# Patient Record
Sex: Female | Born: 1960
Health system: Southern US, Community
[De-identification: ages and names within clinical notes are randomized; demographics above are authoritative.]

## PROBLEM LIST (undated history)

## (undated) DIAGNOSIS — F32A Depression, unspecified: Secondary | ICD-10-CM

## (undated) DIAGNOSIS — F329 Major depressive disorder, single episode, unspecified: Secondary | ICD-10-CM

## (undated) DIAGNOSIS — I10 Essential (primary) hypertension: Secondary | ICD-10-CM

## (undated) HISTORY — DX: Major depressive disorder, single episode, unspecified: F32.9

## (undated) HISTORY — DX: Depression, unspecified: F32.A

## (undated) HISTORY — DX: Essential (primary) hypertension: I10

---

## 2017-03-15 ENCOUNTER — Encounter: Payer: Self-pay | Admitting: Family

## 2017-03-15 ENCOUNTER — Encounter (INDEPENDENT_AMBULATORY_CARE_PROVIDER_SITE_OTHER): Payer: Self-pay

## 2017-03-15 ENCOUNTER — Ambulatory Visit (INDEPENDENT_AMBULATORY_CARE_PROVIDER_SITE_OTHER): Payer: Managed Care, Other (non HMO) | Admitting: Family

## 2017-03-15 VITALS — BP 136/88 | HR 76 | Temp 98.2°F | Ht 64.0 in | Wt 188.0 lb

## 2017-03-15 DIAGNOSIS — Z0000 Encounter for general adult medical examination without abnormal findings: Secondary | ICD-10-CM | POA: Insufficient documentation

## 2017-03-15 DIAGNOSIS — Z7689 Persons encountering health services in other specified circumstances: Secondary | ICD-10-CM | POA: Diagnosis not present

## 2017-03-15 DIAGNOSIS — I10 Essential (primary) hypertension: Secondary | ICD-10-CM | POA: Insufficient documentation

## 2017-03-15 DIAGNOSIS — F339 Major depressive disorder, recurrent, unspecified: Secondary | ICD-10-CM

## 2017-03-15 DIAGNOSIS — R21 Rash and other nonspecific skin eruption: Secondary | ICD-10-CM | POA: Diagnosis not present

## 2017-03-15 MED ORDER — CITALOPRAM HYDROBROMIDE 20 MG PO TABS: ORAL_TABLET | ORAL | 1 refills | Status: DC

## 2017-03-15 MED ORDER — LISINOPRIL-HYDROCHLOROTHIAZIDE 20-12.5 MG PO TABS: 1.0000 | ORAL_TABLET | Freq: Every day | ORAL | 1 refills | Status: DC

## 2017-03-15 MED ORDER — FLUCONAZOLE 150 MG PO TABS: 150.0000 mg | ORAL_TABLET | Freq: Once | ORAL | 1 refills | Status: AC

## 2017-03-15 MED ORDER — CLOTRIMAZOLE 1 % EX CREA: 1.0000 "application " | TOPICAL_CREAM | Freq: Two times a day (BID) | CUTANEOUS | 1 refills | Status: DC

## 2017-03-15 NOTE — Patient Instructions (Addendum)
Labs when fasting  Let me know about hiv screen  Please return for annual physical in new year.   Yeast infection in groin:   HOLD celexa for one day when take diflucan due to interaction; may use cream now or in future.   Daily cleansing of intertriginous skin with a mild cleanser followed by drying of affected area with a hair dryer on a cool setting ?Aeration of affected area when feasible ?Daily application of drying powders ?Use of absorbent material or clothing, such as cotton or merino wool, to separate skin in folds   We placed a referral for mammogram this year. I asked that you call one the below locations and schedule this when it is convenient for you.   As discussed, I would like you to ask for 3D mammogram over the traditional 2D mammogram as new evidence suggest 3D is superior.   Please note that NOT all insurance companies cover 3D and you may have to pay a higher copay. You may call your insurance company to further clarify your benefits.   Options for Mammogram.    [facility]  [address]  [place], [place]  [phone]  * Offers 3D mammogram if you ask*

## 2017-03-15 NOTE — Progress Notes (Signed)
Subjective:    Patient ID: Kimberly Mclaughlin, female    DOB: 03-28-1961, 56 y.o.   MRN: [id number]  CC: Kimberly Mclaughlin is a 56 y.o. female who presents today to establish care.    HPI: No recent PCP  Rash bilateral groins x 6 months ago, worsening. Itchy. Didn't tried otc cream. No vaginal discharge, fever, abdominal pain.   Concern for moles on chest. Wanders if normal. No changed or bleeding.   Depression - doing well on celexa. Had stopped the medication suddenly recently and could tell more depressed. Started back on and thinks  is right dose. No thoughts of hurting herself or anyone else.   HTN- compliant with medication. Denies exertional chest pain or pressure, numbness or tingling radiating to left arm or jaw, palpitations, dizziness, frequent headaches, changes in vision, or shortness of breath.        HISTORY:  Past Medical History:  Diagnosis Date  . Depression   . Hypertension    No past surgical history on file. Family History  Problem Relation Age of Onset  . Cancer Mother        breast  . Heart disease Maternal Grandmother   . Hypertension Maternal Grandmother   . Diabetes Maternal Grandmother   . Heart disease Maternal Grandfather   . Hypertension Maternal Grandfather   . Diabetes Maternal Grandfather     Allergies: Patient has no allergy information on record. No current outpatient prescriptions on file prior to visit.   No current facility-administered medications on file prior to visit.     Social History  Substance Use Topics  . Smoking status: Former Games developer  . Smokeless tobacco: Never Used  . Alcohol use No    Review of Systems  Constitutional: Negative for chills and fever.  Respiratory: Negative for cough.   Cardiovascular: Negative for chest pain and palpitations.  Gastrointestinal: Negative for nausea and vomiting.  Skin: Positive for rash.  Psychiatric/Behavioral: Negative for suicidal ideas.      Objective:    BP 136/88    Pulse 76   Temp 98.2 F (36.8 C) (Oral)   Ht  (1.626 m)   Wt 188 lb (85.3 kg)   SpO2 98%   BMI 32.27 kg/m  BP Readings from Last 3 Encounters:  03/15/17 136/88   Wt Readings from Last 3 Encounters:  03/15/17 188 lb (85.3 kg)    Physical Exam  Constitutional: She appears well-developed and well-nourished.  Eyes: Conjunctivae are normal.  Cardiovascular: Normal rate, regular rhythm, normal heart sounds and normal pulses.   Pulmonary/Chest: Effort normal and breath sounds normal. She has no wheezes. She has no rhonchi. She has no rales.  Neurological: She is alert.  Skin: Skin is warm and dry. No erythema.      darkened areas of skin noted bilateral groin. No satellite lesions, discharge, or increased warmth.   Multiple hyperpigmented acrochordons noted on chest wall. Non bleeding.   Psychiatric: She has a normal mood and affect. Her speech is normal and behavior is normal. Thought content normal.  Vitals reviewed.      Assessment & Plan:   Problem List Items Addressed This Visit      Cardiovascular and Mediastinum   Essential hypertension    At goal. Will continue regimen.       Relevant Medications   lisinopril-hydrochlorothiazide (PRINZIDE,ZESTORETIC) 20-12.5 MG tablet     Musculoskeletal and Integument   Rash - Primary    Working diagnosis intertrigo candidiasis based on location and  puritus. Will treat with diflucan. Advised topical antifungal however patient is on  celexa and she preferred to hold celexa for one day which she has in the past with adverse symptoms. She will use the topical antifungal for future rash earlier on.  For suspected skin tags, advised dermatology consult to ensure no biopsy required.       Relevant Medications   clotrimazole (LOTRIMIN) 1 % cream   fluconazole (DIFLUCAN) 150 MG tablet   Other Relevant Orders   Ambulatory referral to Dermatology     Other   Depression, recurrent (HCC)    stable. Continue current  regimen      Relevant Medications   citalopram (CELEXA) 20 MG tablet   Encounter to establish care   Relevant Orders   MM SCREENING BREAST TOMO BILATERAL   Comprehensive metabolic panel   CBC with Differential/Platelet   Hemoglobin A1c   Hepatitis C antibody   Lipid panel   TSH   VITAMIN D 25 Hydroxy (Vit-D Deficiency, Fractures)       I have changed Kimberly Mclaughlin's lisinopril-hydrochlorothiazide. I am also having her start on clotrimazole and fluconazole. Additionally, I am having her maintain her citalopram.   Meds ordered this encounter  Medications  . DISCONTD: citalopram (CELEXA) 20 MG tablet    Sig: TK 1 T PO QD    Refill:  0  . DISCONTD: lisinopril-hydrochlorothiazide (PRINZIDE,ZESTORETIC) 20-12.5 MG tablet    Sig: Take by mouth.  . citalopram (CELEXA) 20 MG tablet    Sig: TK 1 T PO QD    Dispense:  90 tablet    Refill:  1  . lisinopril-hydrochlorothiazide (PRINZIDE,ZESTORETIC) 20-12.5 MG tablet    Sig: Take 1 tablet by mouth daily.    Dispense:  90 tablet    Refill:  1  . clotrimazole (LOTRIMIN) 1 % cream    Sig: Apply 1 application topically 2 (two) times daily.    Dispense:  30 g    Refill:  1    Order Specific Question:   Supervising Provider    Answer:   Darrick Huntsman, TERESA L [[id number]]  . fluconazole (DIFLUCAN) 150 MG tablet    Sig: Take 1 tablet (150 mg total) by mouth once. Take one tablet PO once. If continue to have symptoms, may take one tablet PO 3 days later.    Dispense:  2 tablet    Refill:  1    Order Specific Question:   Supervising Provider    Answer:   Sherlene Shams [[id number]]    Return precautions given.   Risks, benefits, and alternatives of the medications and treatment plan prescribed today were discussed, and patient expressed understanding.   Education regarding symptom management and diagnosis given to patient on AVS.  Continue to follow with Kimberly Grana, FNP for routine health maintenance.   Renne Crigler and I agreed with  plan.   [name], FNP

## 2017-03-15 NOTE — Assessment & Plan Note (Signed)
At goal. Will continue regimen. 

## 2017-03-15 NOTE — Assessment & Plan Note (Addendum)
Working diagnosis intertrigo candidiasis based on location and puritus. Will treat with diflucan. Advised topical antifungal however patient is on  celexa and she preferred to hold celexa for one day which she has in the past with adverse symptoms. She will use the topical antifungal for future rash earlier on.  For suspected skin tags, advised dermatology consult to ensure no biopsy required.

## 2017-03-15 NOTE — Assessment & Plan Note (Signed)
Mammogram and cpe labs ordered; patient will return for cpe

## 2017-03-15 NOTE — Assessment & Plan Note (Signed)
stable. Continue current regimen

## 2017-03-15 NOTE — Progress Notes (Signed)
Pre visit review using our clinic review tool, if applicable. No additional management support is needed unless otherwise documented below in the visit note. 

## 2017-03-18 ENCOUNTER — Other Ambulatory Visit: Payer: Self-pay

## 2017-03-18 DIAGNOSIS — F339 Major depressive disorder, recurrent, unspecified: Secondary | ICD-10-CM

## 2017-03-18 DIAGNOSIS — I10 Essential (primary) hypertension: Secondary | ICD-10-CM

## 2017-03-18 MED ORDER — CITALOPRAM HYDROBROMIDE 20 MG PO TABS: ORAL_TABLET | ORAL | 1 refills | Status: DC

## 2017-03-18 MED ORDER — LISINOPRIL-HYDROCHLOROTHIAZIDE 20-12.5 MG PO TABS: 1.0000 | ORAL_TABLET | Freq: Every day | ORAL | 1 refills | Status: DC

## 2017-03-21 ENCOUNTER — Other Ambulatory Visit: Payer: Managed Care, Other (non HMO)

## 2017-03-22 ENCOUNTER — Telehealth: Payer: Self-pay | Admitting: Family

## 2017-03-22 NOTE — Telephone Encounter (Signed)
LVM FOR PT TO RTC. NEED TO KNOW WHERE HER LAST MAMMOGRAM WAS PERFORMED, WHEN, AND IF IT WAS NORMAL.

## 2017-06-30 DIAGNOSIS — Z1231 Encounter for screening mammogram for malignant neoplasm of breast: Secondary | ICD-10-CM | POA: Diagnosis not present

## 2017-06-30 LAB — HM MAMMOGRAPHY

## 2017-08-15 ENCOUNTER — Other Ambulatory Visit: Payer: Self-pay | Admitting: Family

## 2017-08-15 DIAGNOSIS — F339 Major depressive disorder, recurrent, unspecified: Secondary | ICD-10-CM

## 2017-09-07 ENCOUNTER — Other Ambulatory Visit: Payer: Self-pay | Admitting: Family

## 2017-09-07 DIAGNOSIS — I10 Essential (primary) hypertension: Secondary | ICD-10-CM

## 2018-01-27 ENCOUNTER — Other Ambulatory Visit: Payer: Self-pay | Admitting: Family

## 2018-01-27 DIAGNOSIS — I10 Essential (primary) hypertension: Secondary | ICD-10-CM

## 2018-01-31 ENCOUNTER — Other Ambulatory Visit: Payer: Self-pay

## 2018-02-23 ENCOUNTER — Other Ambulatory Visit: Payer: Self-pay | Admitting: Family

## 2018-02-23 DIAGNOSIS — F339 Major depressive disorder, recurrent, unspecified: Secondary | ICD-10-CM

## 2018-03-01 ENCOUNTER — Ambulatory Visit (INDEPENDENT_AMBULATORY_CARE_PROVIDER_SITE_OTHER): Payer: 59 | Admitting: Family

## 2018-03-01 ENCOUNTER — Encounter: Payer: Self-pay | Admitting: Family

## 2018-03-01 VITALS — BP 130/100 | HR 81 | Temp 98.7°F | Resp 16 | Ht 64.0 in | Wt 192.2 lb

## 2018-03-01 DIAGNOSIS — I10 Essential (primary) hypertension: Secondary | ICD-10-CM

## 2018-03-01 DIAGNOSIS — Z7689 Persons encountering health services in other specified circumstances: Secondary | ICD-10-CM | POA: Diagnosis not present

## 2018-03-01 DIAGNOSIS — F339 Major depressive disorder, recurrent, unspecified: Secondary | ICD-10-CM | POA: Diagnosis not present

## 2018-03-01 DIAGNOSIS — Z0000 Encounter for general adult medical examination without abnormal findings: Secondary | ICD-10-CM

## 2018-03-01 MED ORDER — CITALOPRAM HYDROBROMIDE 40 MG PO TABS: 40.0000 mg | ORAL_TABLET | Freq: Every day | ORAL | 3 refills | Status: DC

## 2018-03-01 NOTE — Assessment & Plan Note (Addendum)
Elevated today. Asymptomatic. ADvised her to purchase BP cuff which she verbalized understanding. Will keep log at home since had been controlled in the past. She will call with readings so we can adjust  Medications as appropriate.  Note: no baseline EKG in chart. CMA will call patient so she can return for this.

## 2018-03-01 NOTE — Assessment & Plan Note (Signed)
CBE performed. Declines pelvic exam in the absence of complaints and she would like to establish with GYN. Referral placed. Mammogram utd. Orderd colonoscopy, dermatology, and genetics consult.

## 2018-03-01 NOTE — Assessment & Plan Note (Signed)
Doing well on current therapy. Will add on counseling, referral placed.

## 2018-03-01 NOTE — Patient Instructions (Addendum)
Fasting labs tomorrow  Bring office paperwork for Tdap vaccine  Today we discussed referrals, orders. Dermatology, colonoscopy, oncology for genetics   I have placed these orders in the system for you.  Please be sure to give [place] a call if you have not heard from our office regarding this. We should hear from [place] within ONE week with information regarding your appointment. If not, please let me know immediately.   Monitor blood pressure,  Goal is less than 130/80; if persistently higher, please make sooner follow up appointment so we can recheck you blood pressure and manage medications  Health Maintenance, Female Adopting a healthy lifestyle and getting preventive care can go a long way to promote health and wellness. Talk with your health care provider about what schedule of regular examinations is right for you. This is a good chance for you to check in with your provider about disease prevention and staying healthy. In between checkups, there are plenty of things you can do on your own. Experts have done a lot of research about which lifestyle changes and preventive measures are most likely to keep you healthy. Ask your health care provider for more information. Weight and diet Eat a healthy diet  Be sure to include plenty of vegetables, fruits, low-fat dairy products, and lean protein.  Do not eat a lot of foods high in solid fats, added sugars, or salt.  Get regular exercise. This is one of the most important things you can do for your health. ? Most adults should exercise for at least 150 minutes each week. The exercise should increase your heart rate and make you sweat (moderate-intensity exercise). ? Most adults should also do strengthening exercises at least twice a week. This is in addition to the moderate-intensity exercise.  Maintain a healthy weight  Body mass index (BMI) is a measurement that can be used to identify possible weight problems. It estimates body fat based on height  and weight. Your health care provider can help determine your BMI and help you achieve or maintain a healthy weight.  For females 2 years of age and older: ? A BMI below 18.5 is considered underweight. ? A BMI of 18.5 to 24.9 is normal. ? A BMI of 25 to 29.9 is considered overweight. ? A BMI of 30 and above is considered obese.  Watch levels of cholesterol and blood lipids  You should start having your blood tested for lipids and cholesterol at 56 years of age, then have this test every 5 years.  You may need to have your cholesterol levels checked more often if: ? Your lipid or cholesterol levels are high. ? You are older than 56 years of age. ? You are at high risk for heart disease.  Cancer screening Lung Cancer  Lung cancer screening is recommended for adults 44-21 years old who are at high risk for lung cancer because of a history of smoking.  A yearly low-dose CT scan of the lungs is recommended for people who: ? Currently smoke. ? Have quit within the past 15 years. ? Have at least a 30-pack-year history of smoking. A pack year is smoking an average of one pack of cigarettes a day for 1 year.  Yearly screening should continue until it has been 15 years since you quit.  Yearly screening should stop if you develop a health problem that would prevent you from having lung cancer treatment.  Breast Cancer  Practice breast self-awareness. This means understanding how your breasts normally  appear and feel.  It also means doing regular breast self-exams. Let your health care provider know about any changes, no matter how small.  If you are in your 20s or 30s, you should have a clinical breast exam (CBE) by a health care provider every 1-3 years as part of a regular health exam.  If you are 24 or older, have a CBE every year. Also consider having a breast X-ray (mammogram) every year.  If you have a family history of breast cancer, talk to your health care provider about  genetic screening.  If you are at high risk for breast cancer, talk to your health care provider about having an MRI and a mammogram every year.  Breast cancer gene (BRCA) assessment is recommended for women who have family members with BRCA-related cancers. BRCA-related cancers include: ? Breast. ? Ovarian. ? Tubal. ? Peritoneal cancers.  Results of the assessment will determine the need for genetic counseling and BRCA1 and BRCA2 testing.  Cervical Cancer Your health care provider may recommend that you be screened regularly for cancer of the pelvic organs (ovaries, uterus, and vagina). This screening involves a pelvic examination, including checking for microscopic changes to the surface of your cervix (Pap test). You may be encouraged to have this screening done every 3 years, beginning at age 46.  For women ages 30-65, health care providers may recommend pelvic exams and Pap testing every 3 years, or they may recommend the Pap and pelvic exam, combined with testing for human papilloma virus (HPV), every 5 years. Some types of HPV increase your risk of cervical cancer. Testing for HPV may also be done on women of any age with unclear Pap test results.  Other health care providers may not recommend any screening for nonpregnant women who are considered low risk for pelvic cancer and who do not have symptoms. Ask your health care provider if a screening pelvic exam is right for you.  If you have had past treatment for cervical cancer or a condition that could lead to cancer, you need Pap tests and screening for cancer for at least 20 years after your treatment. If Pap tests have been discontinued, your risk factors (such as having a new sexual partner) need to be reassessed to determine if screening should resume. Some women have medical problems that increase the chance of getting cervical cancer. In these cases, your health care provider may recommend more frequent screening and Pap  tests.  Colorectal Cancer  This type of cancer can be detected and often prevented.  Routine colorectal cancer screening usually begins at 56 years of age and continues through 56 years of age.  Your health care provider may recommend screening at an earlier age if you have risk factors for colon cancer.  Your health care provider may also recommend using home test kits to check for hidden blood in the stool.  A small camera at the end of a tube can be used to examine your colon directly (sigmoidoscopy or colonoscopy). This is done to check for the earliest forms of colorectal cancer.  Routine screening usually begins at age 56.  Direct examination of the colon should be repeated every 5-10 years through 56 years of age. However, you may need to be screened more often if early forms of precancerous polyps or small growths are found.  Skin Cancer  Check your skin from head to toe regularly.  Tell your health care provider about any new moles or changes in moles, especially  if there is a change in a mole's shape or color.  Also tell your health care provider if you have a mole that is larger than the size of a pencil eraser.  Always use sunscreen. Apply sunscreen liberally and repeatedly throughout the day.  Protect yourself by wearing long sleeves, pants, a wide-brimmed hat, and sunglasses whenever you are outside.  Heart disease, diabetes, and high blood pressure  High blood pressure causes heart disease and increases the risk of stroke. High blood pressure is more likely to develop in: ? People who have blood pressure in the high end of the normal range (130-139/85-89 mm Hg). ? People who are overweight or obese. ? People who are African American.  If you are 75-74 years of age, have your blood pressure checked every 3-5 years. If you are 65 years of age or older, have your blood pressure checked every year. You should have your blood pressure measured twice-once when you are at  a hospital or clinic, and once when you are not at a hospital or clinic. Record the average of the two measurements. To check your blood pressure when you are not at a hospital or clinic, you can use: ? An automated blood pressure machine at a pharmacy. ? A home blood pressure monitor.  If you are between 55 years and 71 years old, ask your health care provider if you should take aspirin to prevent strokes.  Have regular diabetes screenings. This involves taking a blood sample to check your fasting blood sugar level. ? If you are at a normal weight and have a low risk for diabetes, have this test once every three years after 56 years of age. ? If you are overweight and have a high risk for diabetes, consider being tested at a younger age or more often. Preventing infection Hepatitis B  If you have a higher risk for hepatitis B, you should be screened for this virus. You are considered at high risk for hepatitis B if: ? You were born in a country where hepatitis B is common. Ask your health care provider which countries are considered high risk. ? Your parents were born in a high-risk country, and you have not been immunized against hepatitis B (hepatitis B vaccine). ? You have HIV or AIDS. ? You use needles to inject street drugs. ? You live with someone who has hepatitis B. ? You have had sex with someone who has hepatitis B. ? You get hemodialysis treatment. ? You take certain medicines for conditions, including cancer, organ transplantation, and autoimmune conditions.  Hepatitis C  Blood testing is recommended for: ? Everyone born from [birth year] through 1964. ? Anyone with known risk factors for hepatitis C.  Sexually transmitted infections (STIs)  You should be screened for sexually transmitted infections (STIs) including gonorrhea and chlamydia if: ? You are sexually active and are younger than 56 years of age. ? You are older than 56 years of age and your health care provider tells  you that you are at risk for this type of infection. ? Your sexual activity has changed since you were last screened and you are at an increased risk for chlamydia or gonorrhea. Ask your health care provider if you are at risk.  If you do not have HIV, but are at risk, it may be recommended that you take a prescription medicine daily to prevent HIV infection. This is called pre-exposure prophylaxis (PrEP). You are considered at risk if: ? You are sexually active  and do not regularly use condoms or know the HIV status of your partner(s). ? You take drugs by injection. ? You are sexually active with a partner who has HIV.  Talk with your health care provider about whether you are at high risk of being infected with HIV. If you choose to begin PrEP, you should first be tested for HIV. You should then be tested every 3 months for as long as you are taking PrEP. Pregnancy  If you are premenopausal and you may become pregnant, ask your health care provider about preconception counseling.  If you may become pregnant, take 400 to 800 micrograms (mcg) of folic acid every day.  If you want to prevent pregnancy, talk to your health care provider about birth control (contraception). Osteoporosis and menopause  Osteoporosis is a disease in which the bones lose minerals and strength with aging. This can result in serious bone fractures. Your risk for osteoporosis can be identified using a bone density scan.  If you are 51 years of age or older, or if you are at risk for osteoporosis and fractures, ask your health care provider if you should be screened.  Ask your health care provider whether you should take a calcium or vitamin D supplement to lower your risk for osteoporosis.  Menopause may have certain physical symptoms and risks.  Hormone replacement therapy may reduce some of these symptoms and risks. Talk to your health care provider about whether hormone replacement therapy is right for  you. Follow these instructions at home:  Schedule regular health, dental, and eye exams.  Stay current with your immunizations.  Do not use any tobacco products including cigarettes, chewing tobacco, or electronic cigarettes.  If you are pregnant, do not drink alcohol.  If you are breastfeeding, limit how much and how often you drink alcohol.  Limit alcohol intake to no more than 1 drink per day for nonpregnant women. One drink equals 12 ounces of beer, 5 ounces of wine, or 1 ounces of hard liquor.  Do not use street drugs.  Do not share needles.  Ask your health care provider for help if you need support or information about quitting drugs.  Tell your health care provider if you often feel depressed.  Tell your health care provider if you have ever been abused or do not feel safe at home. This information is not intended to replace advice given to you by your health care provider. Make sure you discuss any questions you have with your health care provider. Document Released: 11/30/2010 Document Revised: 10/23/2015 Document Reviewed: 02/18/2015 Elsevier Interactive Patient Education  [employer].

## 2018-03-01 NOTE — Progress Notes (Signed)
Subjective:    Patient ID: Kimberly Mclaughlin, female    DOB: 1960/08/06, 57 y.o.   MRN: [id number]  CC: Kimberly Mclaughlin is a 57 y.o. female who presents today for physical exam.    HPI: HTN- compliant with medication. Doesn't check at home.  Denies exertional chest pain or pressure, numbness or tingling radiating to left arm or jaw, palpitations, dizziness, frequent headaches, changes in vision, or shortness of breath.    Depression- doing well on medication, 40mg  celexa. 'more jovial on medication at work.' supposed to be on celexa 40mg . No si/hi. Has done counseling in the past and would do again.    Colorectal Cancer Screening: due Breast Cancer Screening: Mammogram UTD. Mother had breast cancer. Interested in genetic consult Cervical Cancer Screening: due. Would like to see GYN.  Bone Health screening/DEXA for 65+: No increased fracture risk. Defer screening at this time. Lung Cancer Screening: Doesn't have 30 year pack year history and age > 55 years.  Immunizations       Tetanus - UTD       Hepatitis C screening - Candidate for, declines HIV Screening- Candidate for , declines Labs: Screening labs today. Exercise: No  regular exercise; joined gym last week.  Alcohol use:  None  Smoking/tobacco use: former smoker. Quit > 15 years ago. Quit in college.   Wears seat belt: Yes.  Skin: has 'skin tags' and would like to see dermatology. No h/o skin cancer  HISTORY:  Past Medical History:  Diagnosis Date  . Depression   . Hypertension     No past surgical history on file. Family History  Problem Relation Age of Onset  . Cancer Mother        breast  . Heart disease Maternal Grandmother   . Hypertension Maternal Grandmother   . Diabetes Maternal Grandmother   . Heart disease Maternal Grandfather   . Hypertension Maternal Grandfather   . Diabetes Maternal Grandfather       ALLERGIES: Patient has no allergy information on record.  Current Outpatient Medications on  File Prior to Visit  Medication Sig Dispense Refill  . clotrimazole (LOTRIMIN) 1 % cream Apply 1 application topically 2 (two) times daily. 30 g 1  . lisinopril-hydrochlorothiazide (PRINZIDE,ZESTORETIC) 20-12.5 MG tablet TAKE 1 TABLET BY MOUTH  DAILY 90 tablet 1   No current facility-administered medications on file prior to visit.     Social History   Tobacco Use  . Smoking status: Former Games developer  . Smokeless tobacco: Never Used  Substance Use Topics  . Alcohol use: No  . Drug use: No    Review of Systems  Constitutional: Negative for chills, fever and unexpected weight change.  HENT: Negative for congestion.   Respiratory: Negative for cough.   Cardiovascular: Negative for chest pain, palpitations and leg swelling.  Gastrointestinal: Negative for nausea and vomiting.  Genitourinary: Negative for pelvic pain, vaginal bleeding and vaginal discharge.  Musculoskeletal: Negative for arthralgias and myalgias.  Skin: Negative for rash.  Neurological: Negative for headaches.  Hematological: Negative for adenopathy.  Psychiatric/Behavioral: Negative for confusion.      Objective:    BP (!) 130/100   Pulse 81   Temp 98.7 F (37.1 C) (Oral)   Resp 16   Ht 5\' 4"  (1.626 m)   Wt 192 lb 4 oz (87.2 kg)   SpO2 99%   BMI 33.00 kg/m   BP Readings from Last 3 Encounters:  03/01/18 (!) 130/100  03/15/17 136/88   Wt Readings from  Last 3 Encounters:  03/01/18 192 lb 4 oz (87.2 kg)  03/15/17 188 lb (85.3 kg)    Physical Exam  Constitutional: She appears well-developed and well-nourished.  Eyes: Conjunctivae are normal.  Neck: No thyroid mass and no thyromegaly present.  Cardiovascular: Normal rate, regular rhythm, normal heart sounds and normal pulses.  Pulmonary/Chest: Effort normal and breath sounds normal. She has no wheezes. She has no rhonchi. She has no rales. Right breast exhibits no inverted nipple, no mass, no nipple discharge, no skin change and no tenderness. Left  breast exhibits no inverted nipple, no mass, no nipple discharge, no skin change and no tenderness. Breasts are symmetrical.  CBE performed.   Lymphadenopathy:       Head (right side): No submental, no submandibular, no tonsillar, no preauricular, no posterior auricular and no occipital adenopathy present.       Head (left side): No submental, no submandibular, no tonsillar, no preauricular, no posterior auricular and no occipital adenopathy present.    She has no cervical adenopathy.       Right cervical: No superficial cervical, no deep cervical and no posterior cervical adenopathy present.      Left cervical: No superficial cervical, no deep cervical and no posterior cervical adenopathy present.    She has no axillary adenopathy.  Neurological: She is alert.  Skin: Skin is warm and dry.  Psychiatric: She has a normal mood and affect. Her speech is normal and behavior is normal. Thought content normal.  Vitals reviewed.      Assessment & Plan:   Problem List Items Addressed This Visit      Cardiovascular and Mediastinum   Essential hypertension    Elevated today. Asymptomatic. ADvised her to purchase BP cuff which she verbalized understanding. Will keep log at home since had been controlled in the past. She will call with readings so we can adjust  Medications as appropriate.  Note: no baseline EKG in chart. CMA will call patient so she can return for this.          Other   Depression, recurrent (HCC)    Doing well on current therapy. Will add on counseling, referral placed.       Relevant Medications   citalopram (CELEXA) 40 MG tablet   Other Relevant Orders   Ambulatory referral to Psychology   Routine physical examination - Primary    CBE performed. Declines pelvic exam in the absence of complaints and she would like to establish with GYN. Referral placed. Mammogram utd. Orderd colonoscopy, dermatology, and genetics consult.           I have discontinued Ethylene  Parham's citalopram and citalopram. I am also having her start on citalopram. Additionally, I am having her maintain her clotrimazole and lisinopril-hydrochlorothiazide.   Meds ordered this encounter  Medications  . citalopram (CELEXA) 40 MG tablet    Sig: Take 1 tablet (40 mg total) by mouth daily.    Dispense:  90 tablet    Refill:  3    Order Specific Question:   Supervising Provider    Answer:   Sherlene Shams [[id number]]    Return precautions given.   Risks, benefits, and alternatives of the medications and treatment plan prescribed today were discussed, and patient expressed understanding.   Education regarding symptom management and diagnosis given to patient on AVS.   Continue to follow with Allegra Grana, FNP for routine health maintenance.   Renne Crigler and I agreed with plan.  Mable Paris, FNP

## 2018-03-02 ENCOUNTER — Other Ambulatory Visit: Payer: 59

## 2018-03-03 NOTE — Progress Notes (Signed)
Left message to return call 

## 2018-03-13 NOTE — Progress Notes (Signed)
Left message for patient to call.

## 2018-03-14 NOTE — Progress Notes (Signed)
Left message to call.

## 2018-03-22 ENCOUNTER — Encounter: Payer: Self-pay | Admitting: *Deleted

## 2018-04-11 ENCOUNTER — Telehealth: Payer: Self-pay | Admitting: Licensed Clinical Social Worker

## 2018-04-11 ENCOUNTER — Encounter: Payer: Self-pay | Admitting: Licensed Clinical Social Worker

## 2018-04-11 NOTE — Telephone Encounter (Signed)
A genetic counseling appt has been scheduled for the pt to see Lacy DuverneyBrianna Cowan on 12/5 at 11am. Letter mailed to the pt

## 2018-04-14 NOTE — Progress Notes (Signed)
Lmtc, mailed letter

## 2018-05-04 ENCOUNTER — Inpatient Hospital Stay: Payer: 59 | Attending: Genetic Counselor | Admitting: Licensed Clinical Social Worker

## 2018-05-04 ENCOUNTER — Inpatient Hospital Stay: Payer: 59

## 2018-06-19 ENCOUNTER — Other Ambulatory Visit: Payer: Self-pay | Admitting: Family

## 2018-06-19 DIAGNOSIS — F339 Major depressive disorder, recurrent, unspecified: Secondary | ICD-10-CM

## 2018-07-05 ENCOUNTER — Other Ambulatory Visit: Payer: Self-pay | Admitting: Family

## 2018-07-05 DIAGNOSIS — F339 Major depressive disorder, recurrent, unspecified: Secondary | ICD-10-CM

## 2018-07-10 ENCOUNTER — Other Ambulatory Visit: Payer: Self-pay | Admitting: Family

## 2018-07-10 DIAGNOSIS — I10 Essential (primary) hypertension: Secondary | ICD-10-CM

## 2018-10-04 ENCOUNTER — Other Ambulatory Visit: Payer: Self-pay | Admitting: Family

## 2018-10-04 DIAGNOSIS — I10 Essential (primary) hypertension: Secondary | ICD-10-CM

## 2018-10-06 ENCOUNTER — Telehealth: Payer: Self-pay

## 2018-10-06 NOTE — Telephone Encounter (Signed)
I called LMTCB asking that patient make f/u appointment to continue to get med refills. She has not been seen since she established care in October.

## 2018-11-29 ENCOUNTER — Telehealth: Payer: Self-pay | Admitting: Family

## 2018-11-29 NOTE — Telephone Encounter (Signed)
That is fine 

## 2018-11-29 NOTE — Telephone Encounter (Signed)
Call pt  Your mammogram at [facility]  is normal this year. Please ensure annual mammogram next unless any concerns, symptoms prior.   Catalina Antigua, NP

## 2018-11-29 NOTE — Telephone Encounter (Signed)
Does she want to transfer care or just see me for a physical?   [place]

## 2018-11-29 NOTE — Telephone Encounter (Signed)
Ok this is fine make appt physical in 03/2019

## 2018-11-30 NOTE — Telephone Encounter (Signed)
LMTCB to try to get patient scheduled for physical with Dr. Olivia Mackie after 03/02/19.

## 2019-05-16 ENCOUNTER — Telehealth: Payer: Self-pay | Admitting: Family

## 2019-05-16 ENCOUNTER — Other Ambulatory Visit: Payer: Self-pay | Admitting: Family

## 2019-05-16 ENCOUNTER — Other Ambulatory Visit: Payer: Self-pay

## 2019-05-16 DIAGNOSIS — I10 Essential (primary) hypertension: Secondary | ICD-10-CM

## 2019-05-16 MED ORDER — LISINOPRIL-HYDROCHLOROTHIAZIDE 20-12.5 MG PO TABS: 1.0000 | ORAL_TABLET | Freq: Every day | ORAL | 0 refills | Status: DC

## 2019-05-16 NOTE — Telephone Encounter (Signed)
Pt is out of lisinopril and would like for you to send it to [facility]. She said she called [facility] but they didn't have the prescription request to send to [place]. Pt scheduled virtual appt for 06/18/19 @ 2pm for a follow up on medicine. She didn't want to do a virtual physical. She is wanting to know if you can give her a prescription until she can see the provider in January?  CB [phone]

## 2019-05-16 NOTE — Telephone Encounter (Signed)
I called and let patient know that I did sned in 30 day of lisinopril to [facility]. I told her just to make sure that she kept f/u appointment on 1/18.

## 2019-06-04 ENCOUNTER — Other Ambulatory Visit: Payer: Self-pay

## 2019-06-04 ENCOUNTER — Telehealth: Payer: Self-pay | Admitting: Family

## 2019-06-04 DIAGNOSIS — I10 Essential (primary) hypertension: Secondary | ICD-10-CM

## 2019-06-04 MED ORDER — LISINOPRIL-HYDROCHLOROTHIAZIDE 20-12.5 MG PO TABS: 1.0000 | ORAL_TABLET | Freq: Every day | ORAL | 0 refills | Status: DC

## 2019-06-04 NOTE — Telephone Encounter (Signed)
I tried to call patient to see what pharmacy this should be sent to. I have sent to [facility] & LM for patient to call back if she would prefer it sent elsewhere.

## 2019-06-04 NOTE — Telephone Encounter (Signed)
Pt has a virtual with Arnett 06/25/2019 and needs a refill on her lisinopril-hydrochlorothiazide (ZESTORETIC) 20-12.5 MG tablet or at least enough until appt.

## 2019-06-08 ENCOUNTER — Other Ambulatory Visit: Payer: Self-pay | Admitting: Family

## 2019-06-08 DIAGNOSIS — I10 Essential (primary) hypertension: Secondary | ICD-10-CM

## 2019-06-09 ENCOUNTER — Other Ambulatory Visit: Payer: Self-pay | Admitting: Family

## 2019-06-09 DIAGNOSIS — F339 Major depressive disorder, recurrent, unspecified: Secondary | ICD-10-CM

## 2019-06-12 NOTE — Telephone Encounter (Signed)
Pt called about needing a refill for citalopram (CELEXA) 40 MG tablet  Pharmacy is [id number] - [place], [place] - [address] OF [address]  Call pt @ [phone]

## 2019-06-18 ENCOUNTER — Ambulatory Visit: Payer: 59 | Admitting: Family

## 2019-06-25 ENCOUNTER — Other Ambulatory Visit: Payer: Self-pay

## 2019-06-25 ENCOUNTER — Ambulatory Visit (INDEPENDENT_AMBULATORY_CARE_PROVIDER_SITE_OTHER): Payer: 59 | Admitting: Family

## 2019-06-25 ENCOUNTER — Encounter: Payer: Self-pay | Admitting: Family

## 2019-06-25 VITALS — Ht 64.0 in | Wt 185.0 lb

## 2019-06-25 DIAGNOSIS — M25512 Pain in left shoulder: Secondary | ICD-10-CM | POA: Diagnosis not present

## 2019-06-25 DIAGNOSIS — I10 Essential (primary) hypertension: Secondary | ICD-10-CM | POA: Diagnosis not present

## 2019-06-25 DIAGNOSIS — F339 Major depressive disorder, recurrent, unspecified: Secondary | ICD-10-CM

## 2019-06-25 MED ORDER — MELOXICAM 7.5 MG PO TABS: 7.5000 mg | ORAL_TABLET | Freq: Every day | ORAL | 1 refills | Status: DC | PRN

## 2019-06-25 MED ORDER — LISINOPRIL-HYDROCHLOROTHIAZIDE 20-12.5 MG PO TABS: 1.0000 | ORAL_TABLET | Freq: Every day | ORAL | 3 refills | Status: DC

## 2019-06-25 MED ORDER — CITALOPRAM HYDROBROMIDE 40 MG PO TABS: ORAL_TABLET | ORAL | 3 refills | Status: DC

## 2019-06-25 NOTE — Assessment & Plan Note (Addendum)
Acute acute onset, reassurance is improving.  Patient advised to go ahead and order cervical and left shoulder x-ray.  Suspect degree of arthritis playing a role.  She does have radicular symptoms and after she might benefit more from an MRI.  No gross limitations in range of motion.  Less likely thinking impingement syndrome, rotator cuff pathology.  We agreed on conservative therapy with meloxicam for the next couple weeks with food, ice regimen, x-rays.  If no improvement at follow-up we will discuss consult with sports medicine, orthopedic

## 2019-06-25 NOTE — Progress Notes (Signed)
Virtual Visit via Video Note  I connected with@  on 06/25/19 at 11:30 AM EST by a video enabled telemedicine application and verified that I am speaking with the correct person using two identifiers.  Location patient: home Location provider: home office Persons participating in the virtual visit: patient, provider  I discussed the limitations of evaluation and management by telemedicine and the availability of in person appointments. The patient expressed understanding and agreed to proceed.   HPI: Complains of left arm pain, x 10 days, better today. 'can at least move it.' Can now sleep on left shoulder whereas she couldn't before due to pain. Woke up with pain in left arm suddenly 10 days ago and couldn't move arm. Continues to feel numbness in finger tip of left hand, intermittently.  Had to prop up left shoulder to relieve pain. Aleve with some relief.  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  Missed work due to pain.   HTN- Compliant with medication. hasnt checked blood pressure. Watch tells HR 68.   Depression- doing well on celexa. States not depressed. No SI/HI.   No h/o gib, ckd.   ROS: See pertinent positives and negatives per HPI.  Past Medical History:  Diagnosis Date  . Depression   . Hypertension     History reviewed. No pertinent surgical history.  Family History  Problem Relation Age of Onset  . Cancer Mother        breast  . Heart disease Maternal Grandmother   . Hypertension Maternal Grandmother   . Diabetes Maternal Grandmother   . Heart disease Maternal Grandfather   . Hypertension Maternal Grandfather   . Diabetes Maternal Grandfather     SOCIAL HX: former smoker   Current Outpatient Medications:  .  citalopram (CELEXA) 40 MG tablet, TAKE 1 TABLET(40 MG) BY MOUTH DAILY, Disp: 90 tablet, Rfl: 3 .  clotrimazole (LOTRIMIN) 1 %  cream, Apply 1 application topically 2 (two) times daily., Disp: 30 g, Rfl: 1 .  lisinopril-hydrochlorothiazide (ZESTORETIC) 20-12.5 MG tablet, Take 1 tablet by mouth daily., Disp: 90 tablet, Rfl: 3 .  meloxicam (MOBIC) 7.5 MG tablet, Take 1 tablet (7.5 mg total) by mouth daily as needed for pain., Disp: 30 tablet, Rfl: 1  EXAM:  VITALS per patient if applicable:  GENERAL: alert, oriented, appears well and in no acute distress  HEENT: atraumatic, conjunttiva clear, no obvious abnormalities on inspection of external nose and ears  NECK: normal movements of the head and neck  LUNGS: on inspection no signs of respiratory distress, breathing rate appears normal, no obvious gross SOB, gasping or wheezing  CV: no obvious cyanosis  MS: moves all visible extremities without noticeable abnormality. No reduction in range of motion as she is able to raise both arms bilaterally, internally rotate.  She does report pain when she internally rotates her left arm  PSYCH/NEURO: pleasant and cooperative, no obvious depression or anxiety, speech and thought processing grossly intact  ASSESSMENT AND PLAN:  Discussed the following assessment and plan:  Acute pain of left shoulder - Plan: meloxicam (MOBIC) 7.5 MG tablet, DG Shoulder Left, DG Cervical Spine Complete, CBC with Differential/Platelet, Comprehensive metabolic panel, Hemoglobin A1c, Lipid panel, TSH, VITAMIN D 25 Hydroxy (Vit-D Deficiency, Fractures), B12 and Folate Panel  Depression, recurrent (HCC) - Plan: citalopram (CELEXA) 40 MG tablet  Essential hypertension - Plan: lisinopril-hydrochlorothiazide (ZESTORETIC) 20-12.5  MG tablet Problem List Items Addressed This Visit      Cardiovascular and Mediastinum   Essential hypertension    No baseline. Advised patient of goal < 120/80. She will monitor at home and let me know if not at goal, and return for CPE as another opportunity to check BP.       Relevant Medications    lisinopril-hydrochlorothiazide (ZESTORETIC) 20-12.5 MG tablet     Other   Depression, recurrent ([place])    Doing well on celexa. Will continue to monitor.       Relevant Medications   citalopram (CELEXA) 40 MG tablet   Left shoulder pain - Primary    Acute acute onset, reassurance is improving.  Patient advised to go ahead and order cervical and left shoulder x-ray.  Suspect degree of arthritis playing a role.  She does have radicular symptoms and after she might benefit more from an MRI.  No gross limitations in range of motion.  Less likely thinking impingement syndrome, rotator cuff pathology.  We agreed on conservative therapy with meloxicam for the next couple weeks with food, ice regimen, x-rays.  If no improvement at follow-up we will discuss consult with sports medicine, orthopedic      Relevant Medications   meloxicam (MOBIC) 7.5 MG tablet   Other Relevant Orders   DG Shoulder Left   DG Cervical Spine Complete   CBC with Differential/Platelet   Comprehensive metabolic panel   Hemoglobin A1c   Lipid panel   TSH   VITAMIN D 25 Hydroxy (Vit-D Deficiency, Fractures)   B12 and Folate Panel      -we discussed possible serious and likely etiologies, options for evaluation and workup, limitations of telemedicine visit vs in person visit, treatment, treatment risks and precautions. Pt prefers to treat via telemedicine empirically rather then risking or undertaking an in person visit at this moment. Patient agrees to seek prompt in person care if worsening, new symptoms arise, or if is not improving with treatment.   I discussed the assessment and treatment plan with the patient. The patient was provided an opportunity to ask questions and all were answered. The patient agreed with the plan and demonstrated an understanding of the instructions.   The patient was advised to call back or seek an in-person evaluation if the symptoms worsen or if the condition fails to improve as  anticipated.   Mable Paris, FNP

## 2019-06-25 NOTE — Assessment & Plan Note (Signed)
No baseline. Advised patient of goal < 120/80. She will monitor at home and let me know if not at goal, and return for CPE as another opportunity to check BP.

## 2019-06-25 NOTE — Assessment & Plan Note (Signed)
Doing well on celexa. Will continue to monitor.

## 2019-06-26 ENCOUNTER — Telehealth: Payer: Self-pay

## 2019-06-26 ENCOUNTER — Telehealth: Payer: Self-pay | Admitting: Lab

## 2019-06-26 DIAGNOSIS — M25512 Pain in left shoulder: Secondary | ICD-10-CM

## 2019-06-26 NOTE — Telephone Encounter (Signed)
Lets have her go for now to [facility].

## 2019-06-26 NOTE — Telephone Encounter (Signed)
I spoke with patient & she decided that she will just go to [facility] for xray. If you reorder then I will let her know so she can go have done. She also will be dropping by [facility] paperwork for intermittent leave from her job due to her shoulder. She said that she is going on her fourth day calling out & she wants to make sure that her job is protected.

## 2019-06-26 NOTE — Telephone Encounter (Signed)
I called & left detailed VM asking patient to call back to let me know if she would prefer xrays at [facility] or [place] location.

## 2019-06-26 NOTE — Telephone Encounter (Signed)
Called Pt No answer, left a VM to call office.  

## 2019-06-26 NOTE — Progress Notes (Signed)
Called Pt No answer, left a VM to call office.  

## 2019-06-26 NOTE — Telephone Encounter (Signed)
Patient called & stated that she was told by Claris Che to come in for xray. She was told that she could just walk-in. Quenten Raven said that I needed to ask you what are we doing with patient's that need xrays since we don't have a way to screen them? Pt did not want to go to hospital but this may be best option? Please advise?

## 2019-06-27 NOTE — Addendum Note (Signed)
Addended by: Allegra Grana on: 06/27/2019 09:55 AM   Modules accepted: Orders

## 2019-06-27 NOTE — Telephone Encounter (Signed)
I called to let patient know that she could go to [facility] at her convenience to have xrays done. Patient stated that she will be dropping paperwork by for [place] to fill out as well.

## 2019-06-27 NOTE — Telephone Encounter (Signed)
Call pt I ordered xrays to [facility]

## 2019-06-28 ENCOUNTER — Ambulatory Visit
Admission: RE | Admit: 2019-06-28 | Discharge: 2019-06-28 | Disposition: A | Discharge status: Home / Self Care | Payer: 59 | Source: Ambulatory Visit | Attending: Family | Admitting: Family

## 2019-06-28 ENCOUNTER — Ambulatory Visit
Admission: RE | Admit: 2019-06-28 | Discharge: 2019-06-28 | Disposition: A | Discharge status: Home / Self Care | Payer: 59 | Attending: Family | Admitting: Family

## 2019-06-28 ENCOUNTER — Other Ambulatory Visit: Payer: Self-pay

## 2019-06-28 DIAGNOSIS — M25512 Pain in left shoulder: Secondary | ICD-10-CM

## 2019-06-29 ENCOUNTER — Other Ambulatory Visit: Payer: Self-pay | Admitting: Family

## 2019-06-29 DIAGNOSIS — M25512 Pain in left shoulder: Secondary | ICD-10-CM

## 2019-07-02 ENCOUNTER — Telehealth: Payer: Self-pay | Admitting: Family

## 2019-07-02 NOTE — Telephone Encounter (Signed)
Pt would like a vm with her xray results from Thursday.  Call pt @ [phone].

## 2019-07-02 NOTE — Telephone Encounter (Signed)
Pt dropped off FMLA paperwork to be filled out. Placed in Margaret's color folder upfront.  Please fax to [phone] Pt also wants a call back on her Xray results

## 2019-07-03 ENCOUNTER — Telehealth: Payer: Self-pay

## 2019-07-03 NOTE — Telephone Encounter (Signed)
Call pt Does she have results from 1/28 Xrays. Please read to her.  I have completed paperwork Does she have an appt with ortho yet?

## 2019-07-03 NOTE — Telephone Encounter (Signed)
When I spoke with her yesterday I gave her xray results. She had not yet been contacted by ortho as of yesterday. I did try to call her earlier to let her know FMLA was faxed.

## 2019-07-03 NOTE — Telephone Encounter (Signed)
Tried to call patient but was unable to reach her to let her know that FMLA has been faxed to [employer].

## 2019-07-05 NOTE — Telephone Encounter (Signed)
Patient is requesting a phone call from Wright about this FMLA . She did say she would make an appointment to speak with her, this paper work needs to be done by 07/15/2019. Also patient said if there is any changes to [facility] paper work she must date and initial in margins.

## 2019-07-05 NOTE — Telephone Encounter (Signed)
Kimberly Mclaughlin, parts of the [employer] paperwork are incomplete. 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

## 2019-07-06 ENCOUNTER — Telehealth: Payer: Self-pay | Admitting: Family

## 2019-07-06 NOTE — Telephone Encounter (Signed)
Patient is requesting a referral in [facility] for Orthopedic doctor, ASAP, per patient

## 2019-07-06 NOTE — Telephone Encounter (Signed)
I called patient & she let me know that she had received in mail a letter about setting up appointment with [facility]. She had received call as well but it appeared that this came from [place]. She did not want to travel that far. I let her know that they did have multiple locations & that there was one here in town. I advised that she call them back & try to make appointment at [facility] in [place] at her convenience.  Patient stated that she would do so. She apologized for the way she acted & her tone when she came into office. She said that [facility] was just very strict & she needed to get appointment as well as FMLA into place. I told her that we were doing what we could on our end to make sure this was done for patient.

## 2019-07-06 NOTE — Telephone Encounter (Signed)
Patient called to see if her 2nd FMLA paper work has be filled out. Please call her.

## 2019-07-06 NOTE — Telephone Encounter (Signed)
I spoke with patient to let her know that we had corrected paperwork for FMLA & have faxed back to [employer].

## 2019-07-16 NOTE — Telephone Encounter (Signed)
I have corrected. I have placed back in blue folder with note where you needed to initial & date.

## 2019-07-16 NOTE — Telephone Encounter (Signed)
Patient  called stating that her FMLA paperwork part C was not filled out . She asked that part C on the previous paper work be filled out initialed, dated and faxed to [employer]. The patient is also requesting to be notified when the paperwork has been faxed. Per the patient if you have any questions please call her. All of this needs to be done by 2.21.21.

## 2019-07-17 NOTE — Telephone Encounter (Signed)
I called and let patient know that this was done & faxed back to [employer].

## 2019-07-17 NOTE — Telephone Encounter (Signed)
Done; let me know if you need anything else

## 2019-11-05 ENCOUNTER — Other Ambulatory Visit: Payer: Self-pay | Admitting: Family

## 2019-11-05 DIAGNOSIS — M25512 Pain in left shoulder: Secondary | ICD-10-CM

## 2019-12-11 ENCOUNTER — Telehealth: Payer: Self-pay | Admitting: Family

## 2019-12-11 NOTE — Telephone Encounter (Signed)
Pt would like a referral for mental health therapy. Please advise

## 2019-12-12 NOTE — Telephone Encounter (Signed)
Call Get more info How is she doing Confirm no thoughts of hurting herself or anyone else. Would She like a referral for counseling?  Or she looking to see a psychiatrist?

## 2019-12-13 NOTE — Telephone Encounter (Signed)
Attempted to call patient. Phone call was disconnected after answering.

## 2019-12-14 NOTE — Telephone Encounter (Signed)
Attempted to call patient again. Phone was disconnected after answering.

## 2019-12-18 NOTE — Telephone Encounter (Signed)
Spoke with pt and she stated that she is not having any thoughts of hurting herself or anyone else. She stated that she thinks her depression medication is not working anymore. She also stated that she would like a referral to speak with a counselor. Moved pt's appt up to next Tuesday at 11:30 with you to discuss depression medication change.

## 2019-12-18 NOTE — Telephone Encounter (Signed)
noted 

## 2019-12-18 NOTE — Telephone Encounter (Signed)
2 attempts at calling Please call once more and then mail letter for patient to call and make an appointment with [place]

## 2019-12-18 NOTE — Telephone Encounter (Signed)
LMTCB. Noticed that pt has an appt scheduled for 12/31/2019.

## 2019-12-25 ENCOUNTER — Other Ambulatory Visit: Payer: Self-pay

## 2019-12-25 ENCOUNTER — Encounter: Payer: Self-pay | Admitting: Family

## 2019-12-25 ENCOUNTER — Ambulatory Visit: Payer: 59 | Admitting: Family

## 2019-12-25 VITALS — BP 118/84 | HR 89 | Temp 98.0°F | Resp 16 | Ht 64.0 in | Wt 176.0 lb

## 2019-12-25 DIAGNOSIS — M25512 Pain in left shoulder: Secondary | ICD-10-CM | POA: Diagnosis not present

## 2019-12-25 DIAGNOSIS — F339 Major depressive disorder, recurrent, unspecified: Secondary | ICD-10-CM

## 2019-12-25 DIAGNOSIS — I10 Essential (primary) hypertension: Secondary | ICD-10-CM | POA: Diagnosis not present

## 2019-12-25 MED ORDER — CITALOPRAM HYDROBROMIDE 10 MG PO TABS: 10.0000 mg | ORAL_TABLET | Freq: Every day | ORAL | 0 refills | Status: DC

## 2019-12-25 MED ORDER — BUPROPION HCL ER (XL) 150 MG PO TB24: ORAL_TABLET | ORAL | 3 refills | Status: DC

## 2019-12-25 NOTE — Assessment & Plan Note (Signed)
Worsening, primarily due to work stress. Refer to counseling and we will start Wellbutrin. Will wean off of Celexa and detailed instructions given on AVS. close follow-up

## 2019-12-25 NOTE — Patient Instructions (Signed)
We are going to wean off the Celexa.  Please start taking 30 mg of Celexa once daily.  Do this for 1 week.  The following week take 20 mg of Celexa for 1 week.  The following week take 10mg  celexa once daily, after one week, you may stop  Start wellbutrin and follow increase instructions  Referral to orthopedics  Let know if you dont hear back within a week in regards to an appointment being scheduled.

## 2019-12-25 NOTE — Progress Notes (Signed)
Subjective:    Patient ID: Kimberly Mclaughlin, female    DOB: 26-Nov-1960, 59 y.o.   MRN: [id number]  CC: Kimberly Mclaughlin is a 59 y.o. female who presents today for follow up.   HPI: Depression- worsening and thinks stress at work and particularly with covid. Tearful. Sleeping a lot and trouble getting out of bed. Has been on celexa for years. No si/hi.Sleeping.No anxiety. No energy.  No h/o eating disorder, seizure.  Very rare alcohol   Has been on intermittent FMLA for left shoulder, would like extended for one year. Continues to have pain in left shoulder and neck. Taking mobic prn with relief. No swelling.   HTN- compliant with medication. No cp, sob.     HISTORY:  Past Medical History:  Diagnosis Date  . Depression   . Hypertension    No past surgical history on file. Family History  Problem Relation Age of Onset  . Cancer Mother        breast  . Heart disease Maternal Grandmother   . Hypertension Maternal Grandmother   . Diabetes Maternal Grandmother   . Heart disease Maternal Grandfather   . Hypertension Maternal Grandfather   . Diabetes Maternal Grandfather     Allergies: Patient has no allergy information on record. Current Outpatient Medications on File Prior to Visit  Medication Sig Dispense Refill  . clotrimazole (LOTRIMIN) 1 % cream Apply 1 application topically 2 (two) times daily. 30 g 1  . lisinopril-hydrochlorothiazide (ZESTORETIC) 20-12.5 MG tablet Take 1 tablet by mouth daily. 90 tablet 3  . meloxicam (MOBIC) 7.5 MG tablet TAKE 1 TABLET(7.5 MG) BY MOUTH DAILY AS NEEDED FOR PAIN 30 tablet 1   No current facility-administered medications on file prior to visit.    Social History   Tobacco Use  . Smoking status: Former Games developer  . Smokeless tobacco: Never Used  Substance Use Topics  . Alcohol use: No  . Drug use: No    Review of Systems    Objective:    BP 118/84   Pulse 89   Temp 98 F (36.7 C)   Resp 16   Ht 5\' 4"  (1.626 m)   Wt 176 lb  (79.8 kg)   SpO2 99%   BMI 30.21 kg/m  BP Readings from Last 3 Encounters:  12/25/19 118/84  03/01/18 (!) 130/100  03/15/17 136/88   Wt Readings from Last 3 Encounters:  12/25/19 176 lb (79.8 kg)  06/25/19 185 lb (83.9 kg)  03/01/18 192 lb 4 oz (87.2 kg)    Physical Exam Vitals reviewed.  Constitutional:      Appearance: She is well-developed.  Eyes:     Conjunctiva/sclera: Conjunctivae normal.  Cardiovascular:     Rate and Rhythm: Normal rate and regular rhythm.     Pulses: Normal pulses.     Heart sounds: Normal heart sounds.  Pulmonary:     Effort: Pulmonary effort is normal.     Breath sounds: Normal breath sounds. No wheezing, rhonchi or rales.  Skin:    General: Skin is warm and dry.  Neurological:     Mental Status: She is alert.  Psychiatric:        Speech: Speech normal.        Behavior: Behavior normal.        Thought Content: Thought content normal.        Assessment & Plan:   Problem List Items Addressed This Visit      Cardiovascular and Mediastinum   Essential hypertension  Stable, continue regimen        Other   Depression, recurrent (HCC) - Primary    Worsening, primarily due to work stress. Refer to counseling and we will start Wellbutrin. Will wean off of Celexa and detailed instructions given on AVS. close follow-up      Relevant Medications   buPROPion (WELLBUTRIN XL) 150 MG 24 hr tablet   citalopram (CELEXA) 10 MG tablet   Other Relevant Orders   TSH   CBC with Differential/Platelet   Comprehensive metabolic panel   Hemoglobin A1c   VITAMIN D 25 Hydroxy (Vit-D Deficiency, Fractures)   B12 and Folate Panel   Ambulatory referral to Psychology   Left shoulder pain    Chronic, largely unchanged. Advised patient to seek evaluation orthopedics which she was agreeable. Referral has been placed      Relevant Orders   Ambulatory referral to Orthopedic Surgery       I have discontinued Anaelle Ragan's citalopram. I am also  having her start on buPROPion and citalopram. Additionally, I am having her maintain her clotrimazole, lisinopril-hydrochlorothiazide, and meloxicam.   Meds ordered this encounter  Medications  . buPROPion (WELLBUTRIN XL) 150 MG 24 hr tablet    Sig: Start 150 mg ER PO qam, increase after 3 days to 300 mg qam.    Dispense:  60 tablet    Refill:  3    Order Specific Question:   Supervising Provider    Answer:   Duncan Dull L [[id number]]  . citalopram (CELEXA) 10 MG tablet    Sig: Take 1 tablet (10 mg total) by mouth daily. Take only while tapering off medication    Dispense:  30 tablet    Refill:  0    Order Specific Question:   Supervising Provider    Answer:   Sherlene Shams [[id number]]    Return precautions given.   Risks, benefits, and alternatives of the medications and treatment plan prescribed today were discussed, and patient expressed understanding.   Education regarding symptom management and diagnosis given to patient on AVS.  Continue to follow with Allegra Grana, FNP for routine health maintenance.   Renne Crigler and I agreed with plan.   [name], FNP

## 2019-12-25 NOTE — Assessment & Plan Note (Signed)
Stable, continue regimen. 

## 2019-12-25 NOTE — Assessment & Plan Note (Signed)
Chronic, largely unchanged. Advised patient to seek evaluation orthopedics which she was agreeable. Referral has been placed

## 2019-12-28 ENCOUNTER — Telehealth: Payer: Self-pay | Admitting: Family

## 2019-12-28 NOTE — Telephone Encounter (Signed)
Pt dropped off FMLA paperwork to be changed  Pt would like a call before and after it is filled out Placed in [employer] upfront

## 2019-12-31 ENCOUNTER — Ambulatory Visit: Payer: 59 | Admitting: Family

## 2019-12-31 NOTE — Telephone Encounter (Signed)
Paperwork has been received and filled out by [employer]. FMLA paperwork has been faxed.

## 2019-12-31 NOTE — Telephone Encounter (Signed)
Pt came by and dropped off an extra copy of FMLA forms. She also wanted a copy of what was filled out. She looked at it and said. That on last page #3 letter b needs to be filled out also with initials by both a and b. If you have questions you can contact her. She said the [employer] where she works is very particular and they might not accept without that part b filled out. Placed old copy and new copy of FMLA in colored folder for Arnett.

## 2020-01-01 NOTE — Telephone Encounter (Signed)
Spoke to patient and confirmed the signature on part D of patient paperwork. The FMLA paper has been faxed to [facility] at [phone] and placed in scan

## 2020-01-01 NOTE — Telephone Encounter (Signed)
Pt called wanting a call back so she can discuss the correct way to fill out the [facility] paperwork

## 2020-01-01 NOTE — Telephone Encounter (Signed)
Pt called in again need a call back about FMLA paperwork

## 2020-01-01 NOTE — Telephone Encounter (Signed)
complted Please call pt

## 2020-01-08 ENCOUNTER — Other Ambulatory Visit: Payer: Self-pay | Admitting: Family

## 2020-01-08 DIAGNOSIS — F339 Major depressive disorder, recurrent, unspecified: Secondary | ICD-10-CM

## 2020-01-31 ENCOUNTER — Telehealth: Payer: Self-pay | Admitting: Family

## 2020-01-31 NOTE — Telephone Encounter (Signed)
Rejection Reason - Patient was No Show - No Show appt on 01/28/2020 with [facility] said about 1 hour ago

## 2020-02-07 ENCOUNTER — Other Ambulatory Visit: Payer: Self-pay | Admitting: Family

## 2020-02-07 DIAGNOSIS — F339 Major depressive disorder, recurrent, unspecified: Secondary | ICD-10-CM

## 2020-02-11 ENCOUNTER — Ambulatory Visit: Payer: 59 | Admitting: Family

## 2020-02-11 DIAGNOSIS — Z0289 Encounter for other administrative examinations: Secondary | ICD-10-CM

## 2020-03-04 ENCOUNTER — Telehealth: Payer: Self-pay | Admitting: Family

## 2020-03-04 DIAGNOSIS — Z1231 Encounter for screening mammogram for malignant neoplasm of breast: Secondary | ICD-10-CM

## 2020-03-04 NOTE — Telephone Encounter (Signed)
Pt would like an order placed for a mammogram to [facility]

## 2020-03-05 NOTE — Telephone Encounter (Signed)
I called patient, but phone only rang. I have faxed order to [facility].

## 2020-03-05 NOTE — Telephone Encounter (Signed)
Call pt Mammogram ordered; advise to schedule

## 2020-03-05 NOTE — Addendum Note (Signed)
Addended by: Allegra Grana on: 03/05/2020 09:58 AM   Modules accepted: Orders

## 2020-03-06 NOTE — Telephone Encounter (Signed)
I tried to call patient, but VM was full.  

## 2020-03-11 NOTE — Telephone Encounter (Signed)
Letter mailed to patient.

## 2020-04-29 ENCOUNTER — Telehealth: Payer: Self-pay | Admitting: Family

## 2020-04-29 NOTE — Telephone Encounter (Signed)
FYI Pt called and informed of results. She wanted to have some resources for councilors, so I have sent her the list we have so she can call to get established. She did say that the Wellbutrin is helping & has been wonderful. I have scheduled her an annual in January per patient request.

## 2020-04-29 NOTE — Telephone Encounter (Signed)
noted 

## 2020-04-29 NOTE — Telephone Encounter (Signed)
Call pt  Your mammogram is normal this year. Please ensure annual mammogram next unless any concerns, symptoms prior.   Best,   Prisila Dlouhy, NP  

## 2020-06-10 ENCOUNTER — Encounter: Payer: 59 | Admitting: Family

## 2020-06-24 ENCOUNTER — Other Ambulatory Visit: Payer: Self-pay | Admitting: Family

## 2020-06-24 DIAGNOSIS — I10 Essential (primary) hypertension: Secondary | ICD-10-CM

## 2020-07-08 ENCOUNTER — Encounter: Payer: 59 | Admitting: Family

## 2020-07-08 DIAGNOSIS — Z0289 Encounter for other administrative examinations: Secondary | ICD-10-CM

## 2020-08-22 ENCOUNTER — Ambulatory Visit (INDEPENDENT_AMBULATORY_CARE_PROVIDER_SITE_OTHER): Payer: 59 | Admitting: Family

## 2020-08-22 ENCOUNTER — Other Ambulatory Visit: Payer: Self-pay

## 2020-08-22 ENCOUNTER — Encounter: Payer: Self-pay | Admitting: Family

## 2020-08-22 VITALS — BP 138/90 | HR 75 | Temp 97.8°F | Ht 64.02 in | Wt 174.6 lb

## 2020-08-22 DIAGNOSIS — Z1211 Encounter for screening for malignant neoplasm of colon: Secondary | ICD-10-CM

## 2020-08-22 DIAGNOSIS — F339 Major depressive disorder, recurrent, unspecified: Secondary | ICD-10-CM

## 2020-08-22 DIAGNOSIS — Z0000 Encounter for general adult medical examination without abnormal findings: Secondary | ICD-10-CM

## 2020-08-22 DIAGNOSIS — I10 Essential (primary) hypertension: Secondary | ICD-10-CM

## 2020-08-22 MED ORDER — BUPROPION HCL ER (XL) 300 MG PO TB24
300.0000 mg | ORAL_TABLET | Freq: Every morning | ORAL | 3 refills | Status: DC
Start: 2020-08-22 — End: 2021-03-17

## 2020-08-22 NOTE — Patient Instructions (Addendum)
It is imperative that you are seen AT least twice per year for labs and monitoring. Monitor blood pressure at home and me 5-6 reading on separate days. Goal is less than 120/80, based on newest guidelines, however we certainly want to be less than 130/80;  if persistently higher, please make sooner follow up appointment so we can recheck you blood pressure and manage/ adjust medications.  Referral for colonoscopy, GYN  Let us know if you dont hear back within a week in regards to an appointment being scheduled.    Health Maintenance, Female Adopting a healthy lifestyle and getting preventive care are important in promoting health and wellness. Ask your health care provider about:  The right schedule for you to have regular tests and exams.  Things you can do on your own to prevent diseases and keep yourself healthy. What should I know about diet, weight, and exercise? Eat a healthy diet  Eat a diet that includes plenty of vegetables, fruits, low-fat dairy products, and lean protein.  Do not eat a lot of foods that are high in solid fats, added sugars, or sodium.   Maintain a healthy weight Body mass index (BMI) is used to identify weight problems. It estimates body fat based on height and weight. Your health care provider can help determine your BMI and help you achieve or maintain a healthy weight. Get regular exercise Get regular exercise. This is one of the most important things you can do for your health. Most adults should:  Exercise for at least 150 minutes each week. The exercise should increase your heart rate and make you sweat (moderate-intensity exercise).  Do strengthening exercises at least twice a week. This is in addition to the moderate-intensity exercise.  Spend less time sitting. Even light physical activity can be beneficial. Watch cholesterol and blood lipids Have your blood tested for lipids and cholesterol at 59 years of age, then have this test every 5  years. Have your cholesterol levels checked more often if:  Your lipid or cholesterol levels are high.  You are older than 59 years of age.  You are at high risk for heart disease. What should I know about cancer screening? Depending on your health history and family history, you may need to have cancer screening at various ages. This may include screening for:  Breast cancer.  Cervical cancer.  Colorectal cancer.  Skin cancer.  Lung cancer. What should I know about heart disease, diabetes, and high blood pressure? Blood pressure and heart disease  High blood pressure causes heart disease and increases the risk of stroke. This is more likely to develop in people who have high blood pressure readings, are of African descent, or are overweight.  Have your blood pressure checked: ? Every 3-5 years if you are 66-105 years of age. ? Every year if you are 86 years old or older. Diabetes Have regular diabetes screenings. This checks your fasting blood sugar level. Have the screening done:  Once every three years after age 17 if you are at a normal weight and have a low risk for diabetes.  More often and at a younger age if you are overweight or have a high risk for diabetes. What should I know about preventing infection? Hepatitis B If you have a higher risk for hepatitis B, you should be screened for this virus. Talk with your health care provider to find out if you are at risk for hepatitis B infection. Hepatitis C Testing is recommended for:  Everyone born from [birth year] through 1964.  Anyone with known risk factors for hepatitis C. Sexually transmitted infections (STIs)  Get screened for STIs, including gonorrhea and chlamydia, if: ? You are sexually active and are younger than 59 years of age. ? You are older than 59 years of age and your health care provider tells you that you are at risk for this type of infection. ? Your sexual activity has changed since you were last  screened, and you are at increased risk for chlamydia or gonorrhea. Ask your health care provider if you are at risk.  Ask your health care provider about whether you are at high risk for HIV. Your health care provider may recommend a prescription medicine to help prevent HIV infection. If you choose to take medicine to prevent HIV, you should first get tested for HIV. You should then be tested every 3 months for as long as you are taking the medicine. Pregnancy  If you are about to stop having your period (premenopausal) and you may become pregnant, seek counseling before you get pregnant.  Take 400 to 800 micrograms (mcg) of folic acid every day if you become pregnant.  Ask for birth control (contraception) if you want to prevent pregnancy. Osteoporosis and menopause Osteoporosis is a disease in which the bones lose minerals and strength with aging. This can result in bone fractures. If you are 71 years old or older, or if you are at risk for osteoporosis and fractures, ask your health care provider if you should:  Be screened for bone loss.  Take a calcium or vitamin D supplement to lower your risk of fractures.  Be given hormone replacement therapy (HRT) to treat symptoms of menopause. Follow these instructions at home: Lifestyle  Do not use any products that contain nicotine or tobacco, such as cigarettes, e-cigarettes, and chewing tobacco. If you need help quitting, ask your health care provider.  Do not use street drugs.  Do not share needles.  Ask your health care provider for help if you need support or information about quitting drugs. Alcohol use  Do not drink alcohol if: ? Your health care provider tells you not to drink. ? You are pregnant, may be pregnant, or are planning to become pregnant.  If you drink alcohol: ? Limit how much you use to 0-1 drink a day. ? Limit intake if you are breastfeeding.  Be aware of how much alcohol is in your drink. In the [place] one  drink equals one 12 oz bottle of beer (355 mL), one 5 oz glass of wine (148 mL), or one 1 oz glass of hard liquor (44 mL). General instructions  Schedule regular health, dental, and eye exams.  Stay current with your vaccines.  Tell your health care provider if: ? You often feel depressed. ? You have ever been abused or do not feel safe at home. Summary  Adopting a healthy lifestyle and getting preventive care are important in promoting health and wellness.  Follow your health care provider's instructions about healthy diet, exercising, and getting tested or screened for diseases.  Follow your health care provider's instructions on monitoring your cholesterol and blood pressure. This information is not intended to replace advice given to you by your health care provider. Make sure you discuss any questions you have with your health care provider. Document Revised: 05/10/2018 Document Reviewed: 05/10/2018 Elsevier Patient Education  2021 [employer].

## 2020-08-22 NOTE — Progress Notes (Signed)
Subjective:    Patient ID: Kimberly Mclaughlin, female    DOB: 07-05-1960, 60 y.o.   MRN: [id number]  CC: Kimberly Mclaughlin is a 60 y.o. female who presents today for physical exam and follow up.    HPI: She complains of stress at work.Employer has told her no time off due to staffing for 2 months.  She would like to have two - three days off per month to do counseling and take care of herself. She is planning to do counseling 2 days per month and would like to arrange herself. Family is not nearby however she does speak with family on the phone and they are supportive.  compliant with wellbutrin 300mg , celexa 10mg  and feels depression controlled on regimen. Reports more energy and positive mood with addition of wellbutrin. No increased anxiety.  No si/hi  HTN- compliant with zestoric qhs. No cp, sob. She doesn't have BP cuff at home.   No longer on mobic.    Colorectal Cancer Screening: due. No changes to bowel habits.  Breast Cancer Screening: Mammogram done [facility] 03/2020, normal.  Cervical Cancer Screening: due Bone Health screening/DEXA for 65+: No increased fracture risk. Defer screening at this time.  Lung Cancer Screening: Doesn't have 20 year pack year history and age > 36 years yo 60 years         Tetanus - utd   Labs: Screening labs today. Exercise: No regular exercise aside from physical exercise at work.   Alcohol use:  none Smoking/tobacco use: former smoker.     HISTORY:  Past Medical History:  Diagnosis Date  . Depression   . Hypertension     History reviewed. No pertinent surgical history. Family History  Problem Relation Age of Onset  . Cancer Mother 22       breast  . Heart disease Maternal Grandmother   . Hypertension Maternal Grandmother   . Diabetes Maternal Grandmother   . Heart disease Maternal Grandfather   . Hypertension Maternal Grandfather   . Diabetes Maternal Grandfather   . Colon cancer Neg Hx       ALLERGIES: Patient has no allergy  information on record.  Current Outpatient Medications on File Prior to Visit  Medication Sig Dispense Refill  . citalopram (CELEXA) 10 MG tablet TAKE 1 TABLET BY MOUTH  DAILY. TAKE ONLY WHILE  TAPERING OFF MEDICATION 30 tablet 11  . clotrimazole (LOTRIMIN) 1 % cream Apply 1 application topically 2 (two) times daily. 30 g 1  . lisinopril-hydrochlorothiazide (ZESTORETIC) 20-12.5 MG tablet TAKE 1 TABLET BY MOUTH DAILY 90 tablet 3  . meloxicam (MOBIC) 7.5 MG tablet TAKE 1 TABLET(7.5 MG) BY MOUTH DAILY AS NEEDED FOR PAIN 30 tablet 1   No current facility-administered medications on file prior to visit.    Social History   Tobacco Use  . Smoking status: Former 89  . Smokeless tobacco: Never Used  . Tobacco comment: smoked cigarretes in highschool  Substance Use Topics  . Alcohol use: No  . Drug use: No    Review of Systems  Constitutional: Negative for chills and fever.  Respiratory: Negative for cough.   Cardiovascular: Negative for chest pain and palpitations.  Gastrointestinal: Negative for nausea and vomiting.  Psychiatric/Behavioral: Negative for sleep disturbance and suicidal ideas. The patient is not nervous/anxious.       Objective:    BP 138/90 (BP Location: Left Arm, Patient Position: Sitting)   Pulse 75   Temp 97.8 F (36.6 C)   Ht 5' 4.02" (  1.626 m)   Wt 174 lb 9.6 oz (79.2 kg)   SpO2 98%   BMI 29.96 kg/m   BP Readings from Last 3 Encounters:  08/22/20 138/90  12/25/19 118/84  03/01/18 (!) 130/100   Wt Readings from Last 3 Encounters:  08/22/20 174 lb 9.6 oz (79.2 kg)  12/25/19 176 lb (79.8 kg)  06/25/19 185 lb (83.9 kg)    Physical Exam Vitals reviewed.  Constitutional:      Appearance: She is well-developed.  Eyes:     Conjunctiva/sclera: Conjunctivae normal.  Neck:     Thyroid: No thyroid mass or thyromegaly.  Cardiovascular:     Rate and Rhythm: Normal rate and regular rhythm.     Pulses: Normal pulses.     Heart sounds: Normal heart  sounds.  Pulmonary:     Effort: Pulmonary effort is normal.     Breath sounds: Normal breath sounds. No wheezing, rhonchi or rales.  Lymphadenopathy:     Head:     Right side of head: No submental, submandibular, tonsillar, preauricular, posterior auricular or occipital adenopathy.     Left side of head: No submental, submandibular, tonsillar, preauricular, posterior auricular or occipital adenopathy.     Cervical: No cervical adenopathy.  Skin:    General: Skin is warm and dry.  Neurological:     Mental Status: She is alert.  Psychiatric:        Speech: Speech normal.        Behavior: Behavior normal.        Thought Content: Thought content normal.        Assessment & Plan:   Problem List Items Addressed This Visit      Cardiovascular and Mediastinum   Essential hypertension    Elevated today. Patient suspects thinking about and about to head into work has made her feel stressed. She would like to monitor blood pressure and return in one months time prior to making any adjustments which I think is reasonable. Continue lisinopril- hctz 20-12.5mg .         Other   Depression, recurrent (HCC)    Suboptimal control. She will initiate counseling and declines a referral from me today. Agreed that for counseling and to protect her employment, we should complete FMLA so she can focus on her mental health. continue wellbutrin 300mg , celexa 10mg . Close follow up.       Relevant Medications   buPROPion (WELLBUTRIN XL) 300 MG 24 hr tablet   Routine physical examination - Primary    Due pap and mammogram. She declines pelvic exam as she would like to establish with GYN, referral placed. She declines CBE with me today. Encouraged formal exercise program outside of work.       Relevant Medications   buPROPion (WELLBUTRIN XL) 300 MG 24 hr tablet   Other Relevant Orders   CBC with Differential/Platelet   Comprehensive metabolic panel   Hemoglobin A1c   Lipid panel   VITAMIN D 25  Hydroxy (Vit-D Deficiency, Fractures)   TSH   Ambulatory referral to Obstetrics / Gynecology   Ambulatory referral to Gastroenterology    Other Visit Diagnoses    Screen for colon cancer       Relevant Orders   Ambulatory referral to Gastroenterology       I have changed Szafran's buPROPion. I am also having her maintain her clotrimazole, meloxicam, citalopram, and lisinopril-hydrochlorothiazide.   Meds ordered this encounter  Medications  . buPROPion (WELLBUTRIN XL) 300 MG 24 hr tablet  Sig: Take 1 tablet (300 mg total) by mouth in the morning.    Dispense:  90 tablet    Refill:  3    Requesting 1 year supply    Order Specific Question:   Supervising Provider    Answer:   Sherlene Shams [[id number]]    Return precautions given.   Risks, benefits, and alternatives of the medications and treatment plan prescribed today were discussed, and patient expressed understanding.   Education regarding symptom management and diagnosis given to patient on AVS.   Continue to follow with Allegra Grana, FNP for routine health maintenance.   Renne Crigler and I agreed with plan.   [name], FNP

## 2020-08-22 NOTE — Assessment & Plan Note (Signed)
Suboptimal control. She will initiate counseling and declines a referral from me today. Agreed that for counseling and to protect her employment, we should complete FMLA so she can focus on her mental health. continue wellbutrin 300mg , celexa 10mg . Close follow up.

## 2020-08-22 NOTE — Assessment & Plan Note (Signed)
Elevated today. Patient suspects thinking about and about to head into work has made her feel stressed. She would like to monitor blood pressure and return in one months time prior to making any adjustments which I think is reasonable. Continue lisinopril- hctz 20-12.5mg .

## 2020-08-22 NOTE — Assessment & Plan Note (Signed)
Due pap and mammogram. She declines pelvic exam as she would like to establish with GYN, referral placed. She declines CBE with me today. Encouraged formal exercise program outside of work.

## 2020-09-05 ENCOUNTER — Other Ambulatory Visit: Payer: 59

## 2020-09-17 ENCOUNTER — Encounter: Payer: Self-pay | Admitting: *Deleted

## 2020-09-30 ENCOUNTER — Telehealth: Payer: Self-pay | Admitting: Family

## 2020-09-30 ENCOUNTER — Encounter: Payer: Self-pay | Admitting: Family

## 2020-09-30 ENCOUNTER — Telehealth (INDEPENDENT_AMBULATORY_CARE_PROVIDER_SITE_OTHER): Payer: 59 | Admitting: Family

## 2020-09-30 ENCOUNTER — Other Ambulatory Visit: Payer: Self-pay

## 2020-09-30 DIAGNOSIS — F339 Major depressive disorder, recurrent, unspecified: Secondary | ICD-10-CM

## 2020-09-30 DIAGNOSIS — I10 Essential (primary) hypertension: Secondary | ICD-10-CM | POA: Diagnosis not present

## 2020-09-30 DIAGNOSIS — M25512 Pain in left shoulder: Secondary | ICD-10-CM | POA: Diagnosis not present

## 2020-09-30 MED ORDER — AMLODIPINE BESYLATE 5 MG PO TABS: 5.0000 mg | ORAL_TABLET | Freq: Every day | ORAL | 4 refills | Status: DC

## 2020-09-30 NOTE — Telephone Encounter (Signed)
FYI patient is scheduled 5/18 for BP check & labs. All labs just need to be ordered for [facility], but she will have drawn in our office. I have not ordered yet. Was noit sure what was needed. She will start amlodipine 5mg  that I have sent. She will also continue to her Zestoretic 20-12.5mg . Pt advised that in CP, SOB, HA, vision changes, jaw pain, left arm pain numbness or tingling to proceed to ED ASAP. Also advised to please watch salt intake as well since this will raise BP.

## 2020-09-30 NOTE — Telephone Encounter (Signed)
PT called to advise that she has no link to start the appointment today.

## 2020-09-30 NOTE — Progress Notes (Signed)
Virtual Visit via Video Note  I connected with@  on 10/01/20 at 11:00 AM EDT by a video enabled telemedicine application and verified that I am speaking with the correct person using two identifiers.  Location patient: in parking lot in the car Location provider:home Persons participating in the virtual visit: patient, provider  I discussed the limitations of evaluation and management by telemedicine and the availability of in person appointments. The patient expressed understanding and agreed to proceed.  Note: I have had covid exposure and awaiting results of PCR test therefore working from home , virtually.   Interactive audio and video telecommunications were attempted between this provider and patient, however failed, due to patient having technical difficulties or patient did not have access to video capability.  We continued and completed visit with audio only.   HPI: Feels well today. No new complaints.   Primarily reason for visit is to complete FMLA forms as she would like time away to pursue therapy for depression and self care including doctors appointments. She also h/o left shoulder pain which she would like to be included on form.     She would like 3 days out of the month if needed for exacerbation of depression or shoulder pain.  She feels like wellbutrin 300mg  and celexa 10mg  are working 'beautifully'. Depression overall improved and she plans to start counseling as well   HTN- compliant with lisinopril- hctz 20-12.5mg . No cp, ha, sob. She doesn't have a bp machine at home and would like blood pressure checked today   She was seen by PA at [facility] 07/2019.       ROS: See pertinent positives and negatives per HPI.    EXAM:  VITALS per patient if applicable: BP (!) 148/108 (BP Location: Left Arm, Patient Position: Sitting, Cuff Size: Large)   Pulse 81   Temp (!) 96.6 F (35.9 C) (Temporal)   Ht 5' 4.02" (1.626 m)   Wt 175 lb (79.4 kg)    SpO2 97%   BMI 30.02 kg/m  BP Readings from Last 3 Encounters:  09/30/20 (!) 148/108  08/22/20 138/90  12/25/19 118/84   Wt Readings from Last 3 Encounters:  09/30/20 175 lb (79.4 kg)  08/22/20 174 lb 9.6 oz (79.2 kg)  12/25/19 176 lb (79.8 kg)     ASSESSMENT AND PLAN:  Discussed the following assessment and plan:  Problem List Items Addressed This Visit      Cardiovascular and Mediastinum   Essential hypertension    Uncontrolled. Continue lisinopril- hctz 20-12.5mg .Start amlodipine 5mg . Repeat BP check with RN visit in one week.          Other   Depression, recurrent (HCC)    Stable. Continue wellbutrin 300mg  and celexa 10mg .She will start counseling. FMLA form completed for depression, left shoulder pain. Will follow.       Left shoulder pain    Chronic. Advised to re-establish with [facility] for ongoing evaluation, treatment.          -we discussed possible serious and likely etiologies, options for evaluation and workup, limitations of telemedicine visit vs in person visit, treatment, treatment risks and precautions. Pt prefers to treat via telemedicine empirically rather then risking or undertaking an in person visit at this moment.  .   I discussed the assessment and treatment plan with the patient. The patient was provided an opportunity to ask questions and all were answered. The patient agreed with the plan and demonstrated an understanding of the instructions.  The patient was advised to call back or seek an in-person evaluation if the symptoms worsen or if the condition fails to improve as anticipated.  I have spent 16 minutes with a patient including precharting, exam, reviewing medical records, and discussion plan of care.      [name], FNP

## 2020-09-30 NOTE — Telephone Encounter (Signed)
Call pt  I reviewed BP and again thank her for being so flexible with appt today  I advise that she continue zestoretic 20-12.5mg  qd and start amlodipine 5mg  PO QD in addition too  What are her thoughts?  If in agreement , please send in.   Please sch BP nurse visit on day when I am in the office in 2 weeks.  She is also very due for labs. Can she come back this week and have these done? Does she want to do at [facility]?  We need to see electrolytes and must monitor every 6 months due to her medications.   Educate her signs of red flags with blood pressure  that any cp, sob, Ha, vision changes, she would need to report to nearest ED.  Also reiterate the importance of low salt diet.    BP Readings from Last 3 Encounters:  09/30/20 (!) 148/108  08/22/20 138/90  12/25/19 118/84

## 2020-09-30 NOTE — Telephone Encounter (Signed)
Lab ordered They are fasting Please ensure pt fasts

## 2020-10-01 NOTE — Assessment & Plan Note (Signed)
Uncontrolled. Continue lisinopril- hctz 20-12.5mg .Start amlodipine 5mg . Repeat BP check with RN visit in one week.

## 2020-10-01 NOTE — Telephone Encounter (Signed)
Patient informed to fast.

## 2020-10-01 NOTE — Assessment & Plan Note (Signed)
Chronic. Advised to re-establish with [facility] for ongoing evaluation, treatment.

## 2020-10-01 NOTE — Assessment & Plan Note (Signed)
Stable. Continue wellbutrin 300mg  and celexa 10mg .She will start counseling. FMLA form completed for depression, left shoulder pain. Will follow.

## 2020-10-02 NOTE — Progress Notes (Signed)
LMTCB

## 2020-10-03 ENCOUNTER — Telehealth: Payer: Self-pay

## 2020-10-03 NOTE — Telephone Encounter (Signed)
Pt called and asked to speak with you about her FMLA paperwork. Please advise

## 2020-10-03 NOTE — Telephone Encounter (Signed)
I called patient & was unable to leave message. FMLA completed & will be faxed today.

## 2020-10-07 NOTE — Telephone Encounter (Signed)
I have faxed.  

## 2020-10-07 NOTE — Telephone Encounter (Signed)
I have added in start & end date to correct paperwork. I have faxed paperwork back again to [facility] group.

## 2020-10-07 NOTE — Telephone Encounter (Signed)
Patient called in stated that you need to go to part c put in the start and end date

## 2020-10-07 NOTE — Telephone Encounter (Signed)
I have spoken with patient & first page somehow was not faxed to [facility] group. I will refax today.

## 2020-10-15 ENCOUNTER — Ambulatory Visit: Payer: 59

## 2020-10-15 ENCOUNTER — Other Ambulatory Visit: Payer: 59

## 2021-01-05 ENCOUNTER — Other Ambulatory Visit: Payer: Self-pay

## 2021-01-05 ENCOUNTER — Other Ambulatory Visit (INDEPENDENT_AMBULATORY_CARE_PROVIDER_SITE_OTHER): Payer: 59

## 2021-01-05 DIAGNOSIS — Z0000 Encounter for general adult medical examination without abnormal findings: Secondary | ICD-10-CM | POA: Diagnosis not present

## 2021-01-05 LAB — COMPREHENSIVE METABOLIC PANEL
ALT: 14 U/L (ref 0–35)
AST: 14 U/L (ref 0–37)
Albumin: 4.1 g/dL (ref 3.5–5.2)
Alkaline Phosphatase: 72 U/L (ref 39–117)
BUN: 19 mg/dL (ref 6–23)
CO2: 26 mEq/L (ref 19–32)
Calcium: 9.4 mg/dL (ref 8.4–10.5)
Chloride: 104 mEq/L (ref 96–112)
Creatinine, Ser: 0.79 mg/dL (ref 0.40–1.20)
GFR: 81.81 mL/min (ref >60.00)
Glucose, Bld: 99 mg/dL (ref 70–99)
Potassium: 4.2 mEq/L (ref 3.5–5.1)
Sodium: 138 mEq/L (ref 135–145)
Total Bilirubin: 0.3 mg/dL (ref 0.2–1.2)
Total Protein: 7 g/dL (ref 6.0–8.3)

## 2021-01-05 LAB — CBC WITH DIFFERENTIAL/PLATELET
Basophils Absolute: 0.1 K/uL (ref 0.0–0.1)
Basophils Relative: 0.9 % (ref 0.0–3.0)
Eosinophils Absolute: 0.2 K/uL (ref 0.0–0.7)
Eosinophils Relative: 2.4 % (ref 0.0–5.0)
HCT: 41.7 % (ref 36.0–46.0)
Hemoglobin: 13.6 g/dL (ref 12.0–15.0)
Lymphocytes Relative: 32.2 % (ref 12.0–46.0)
Lymphs Abs: 3 K/uL (ref 0.7–4.0)
MCHC: 32.5 g/dL (ref 30.0–36.0)
MCV: 89 fl (ref 78.0–100.0)
Monocytes Absolute: 0.7 K/uL (ref 0.1–1.0)
Monocytes Relative: 7.2 % (ref 3.0–12.0)
Neutro Abs: 5.4 K/uL (ref 1.4–7.7)
Neutrophils Relative %: 57.3 % (ref 43.0–77.0)
Platelets: 345 K/uL (ref 150.0–400.0)
RBC: 4.68 Mil/uL (ref 3.87–5.11)
RDW: 14.7 % (ref 11.5–15.5)
WBC: 9.4 K/uL (ref 4.0–10.5)

## 2021-01-05 LAB — LIPID PANEL
Cholesterol: 177 mg/dL (ref 0–200)
HDL: 55.6 mg/dL (ref >39.00)
LDL Cholesterol: 100 mg/dL — ABNORMAL HIGH (ref 0–99)
NonHDL: 121.27
Total CHOL/HDL Ratio: 3
Triglycerides: 108 mg/dL (ref 0.0–149.0)
VLDL: 21.6 mg/dL (ref 0.0–40.0)

## 2021-01-05 LAB — TSH: TSH: 0.46 u[IU]/mL (ref 0.35–5.50)

## 2021-01-05 LAB — VITAMIN D 25 HYDROXY (VIT D DEFICIENCY, FRACTURES): VITD: 24.65 ng/mL — ABNORMAL LOW (ref 30.00–100.00)

## 2021-01-05 LAB — HEMOGLOBIN A1C: Hgb A1c MFr Bld: 6.3 % (ref 4.6–6.5)

## 2021-01-06 ENCOUNTER — Telehealth: Payer: Self-pay

## 2021-01-06 NOTE — Telephone Encounter (Signed)
LMTCB for lab results.  

## 2021-01-07 NOTE — Telephone Encounter (Signed)
Pt called back returning your call °

## 2021-01-07 NOTE — Telephone Encounter (Signed)
See result note.  

## 2021-02-19 ENCOUNTER — Other Ambulatory Visit: Payer: Self-pay | Admitting: Family

## 2021-02-19 DIAGNOSIS — I10 Essential (primary) hypertension: Secondary | ICD-10-CM

## 2021-03-17 ENCOUNTER — Other Ambulatory Visit: Payer: Self-pay

## 2021-03-17 ENCOUNTER — Telehealth (INDEPENDENT_AMBULATORY_CARE_PROVIDER_SITE_OTHER): Payer: 59 | Admitting: Family

## 2021-03-17 ENCOUNTER — Encounter: Payer: Self-pay | Admitting: Family

## 2021-03-17 VITALS — Ht 64.0 in | Wt 176.0 lb

## 2021-03-17 DIAGNOSIS — E785 Hyperlipidemia, unspecified: Secondary | ICD-10-CM | POA: Diagnosis not present

## 2021-03-17 DIAGNOSIS — F339 Major depressive disorder, recurrent, unspecified: Secondary | ICD-10-CM

## 2021-03-17 DIAGNOSIS — Z0000 Encounter for general adult medical examination without abnormal findings: Secondary | ICD-10-CM

## 2021-03-17 DIAGNOSIS — I10 Essential (primary) hypertension: Secondary | ICD-10-CM

## 2021-03-17 MED ORDER — ROSUVASTATIN CALCIUM 5 MG PO TABS: 5.0000 mg | ORAL_TABLET | Freq: Every evening | ORAL | 3 refills | Status: DC

## 2021-03-17 MED ORDER — CITALOPRAM HYDROBROMIDE 10 MG PO TABS: 10.0000 mg | ORAL_TABLET | Freq: Every day | ORAL | 1 refills | Status: DC

## 2021-03-17 MED ORDER — LISINOPRIL-HYDROCHLOROTHIAZIDE 20-12.5 MG PO TABS: 1.0000 | ORAL_TABLET | Freq: Every day | ORAL | 3 refills | Status: DC

## 2021-03-17 MED ORDER — BUPROPION HCL ER (XL) 300 MG PO TB24: 300.0000 mg | ORAL_TABLET | Freq: Every morning | ORAL | 3 refills | Status: DC

## 2021-03-17 NOTE — Assessment & Plan Note (Signed)
Unsure if controlled. She will check BP at local pharmacy. Continue lisinopril- hctz 20-12.5mg , amlodipine 5mg .

## 2021-03-17 NOTE — Assessment & Plan Note (Signed)
Uncontrolled. She was agreeable to starting crestor 5mg .  Follow up in 6 weeks and we will collect cmp
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
## 2021-03-17 NOTE — Patient Instructions (Addendum)
Start crestor 5mg  and resume celexa 10mg .   Fasting labs at follow up  Call [facility] to schedule colonoscopy  [phone]

## 2021-03-17 NOTE — Assessment & Plan Note (Signed)
Suboptimal control. She was off of celexa 10mg . She will resume, continue wellbutrin 300mg .

## 2021-03-19 NOTE — Progress Notes (Signed)
I called patient, but was unable to reach no VM was set up.

## 2021-03-24 ENCOUNTER — Telehealth: Payer: Self-pay | Admitting: Family

## 2021-03-24 NOTE — Telephone Encounter (Signed)
Patient is calling in to check on the status of her FMLA paperwork that was faxed over last week on Tuesday or Wednesday.Please advise.

## 2021-03-24 NOTE — Telephone Encounter (Signed)
Per Shanda Bumps patient called back in about her FMLA to see if we received it.Sarah received the patients paperwork and stated that she will call the patient back once her paperwork is finished.

## 2021-03-27 NOTE — Telephone Encounter (Signed)
Patient came into office. Question #2 needs a yes or no, question # 3b should be dated 04/01/2021 to 09/29/2021. 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 Please fax to [employer] [phone]

## 2021-03-30 NOTE — Telephone Encounter (Signed)
I have revised & faxed back to [employer].

## 2021-04-02 NOTE — Telephone Encounter (Signed)
Patient was in the office, she left a copy of her FMLA paper work. It was also faxed over to the office. Please call patient at [phone]. The copy of the FMLA that was faxed in the Pod that Arnett is at was faxed today, 06/02/2020. FMLA paper work is due tomorrow, 04/03/2021. Copy of the FMLA paper work that patient dropped off is up front in Arnett's color folder.

## 2021-04-02 NOTE — Telephone Encounter (Signed)
Pt called back in about her FMLA paperwork. Pt stated that the FMLA paperwork was not completed correctly. Pt stated that the paperwork was revised but not initial by doctor and the doctor office visit dates were not  on the form. Pt stated that [employer] will fax over paperwork again. Pt would like callback.

## 2021-04-02 NOTE — Telephone Encounter (Signed)
I have spoken with patient on what needed correcting on prior FMLA paperwork as well as received the [employer]. I have corrected to the best of my knowledge & faxed back. Pt aware of this.

## 2021-04-07 NOTE — Telephone Encounter (Signed)
Marylu Lund from [employer] reached out to [place] today regarding patients FMLA paperwork. They just need Dr. Jason Coop to sign and date two sections because they can't accept without.  Sections that need to be signed and dated: Part A Question 5 Part C Question 3  Fax number is [phone]

## 2021-04-07 NOTE — Telephone Encounter (Signed)
New form has been complete by Claris Che & faxed to [facility].

## 2021-04-07 NOTE — Telephone Encounter (Signed)
Pt called in requesting update on FMLA paperwork. Advise Pt of last note date 04/02/21. Pt stated she contacted [employer]. Pt stated that [employer] is still having issues with the paperwork. Pt would like callback.

## 2021-07-20 IMAGING — CR DG CERVICAL SPINE COMPLETE 4+V
1 series · 5 of 5 positions shown · non-contrast
Comparison: None.

CLINICAL DATA: Left arm pain and numbness since 06/15/2019. No
specific injury.

EXAM:
CERVICAL SPINE - COMPLETE 4+ VIEW

[Series 1: dg cervical spine complete · 0.14mm/px · 5 of 5 slices shown]
[im 1/5]
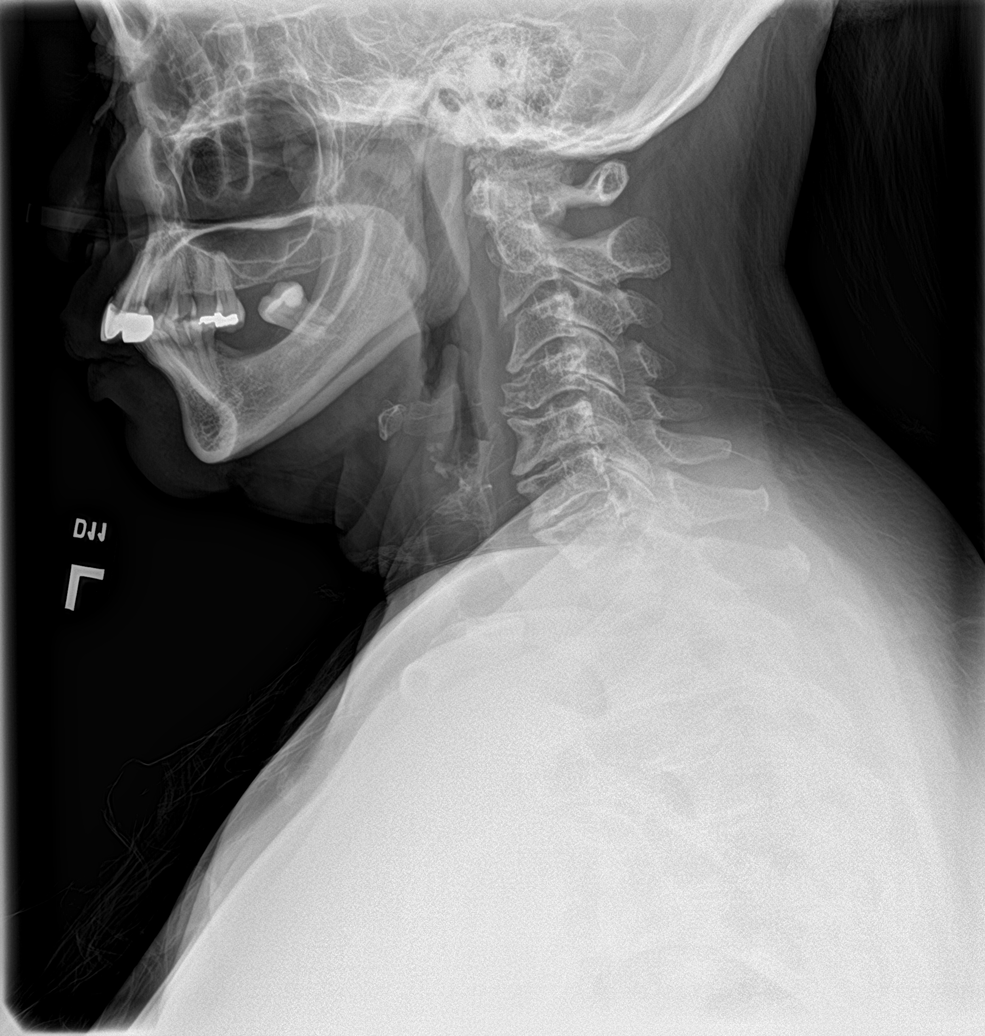
[im 2/5]
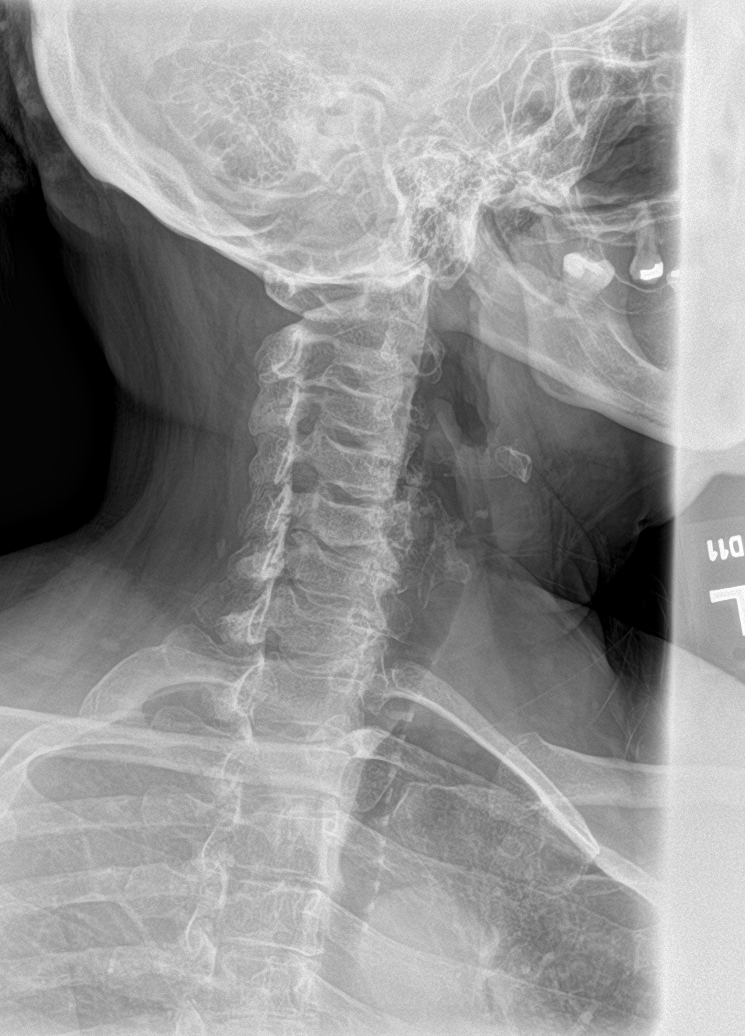
[im 3/5]
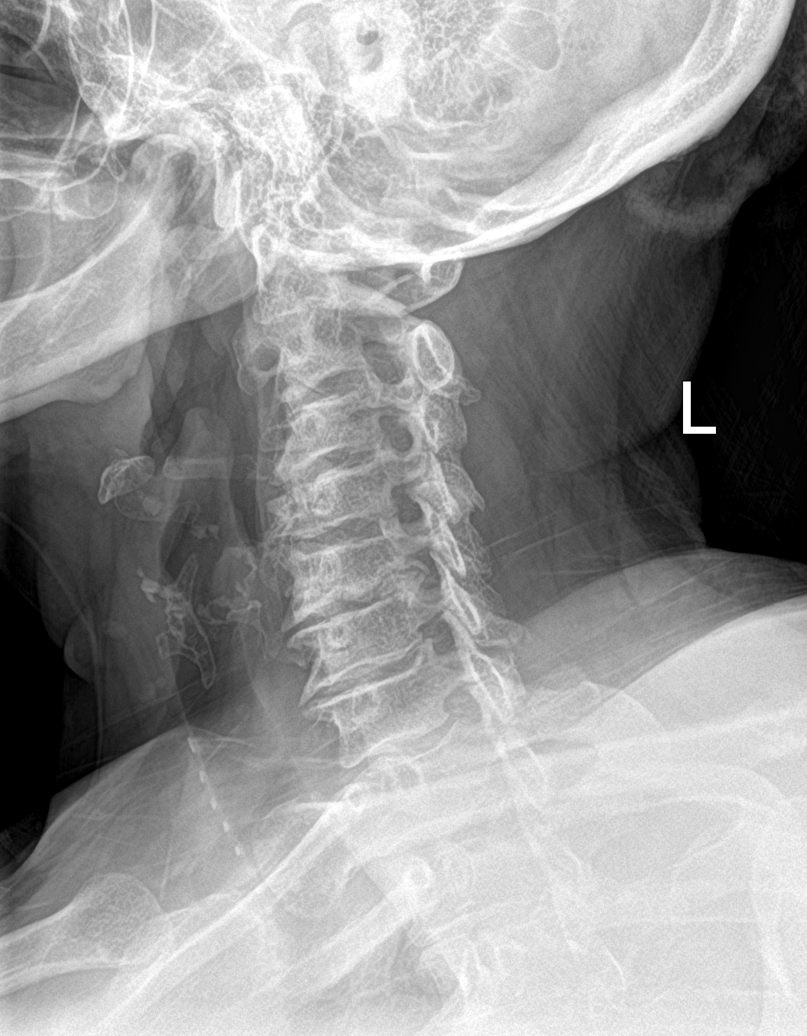
[im 4/5]
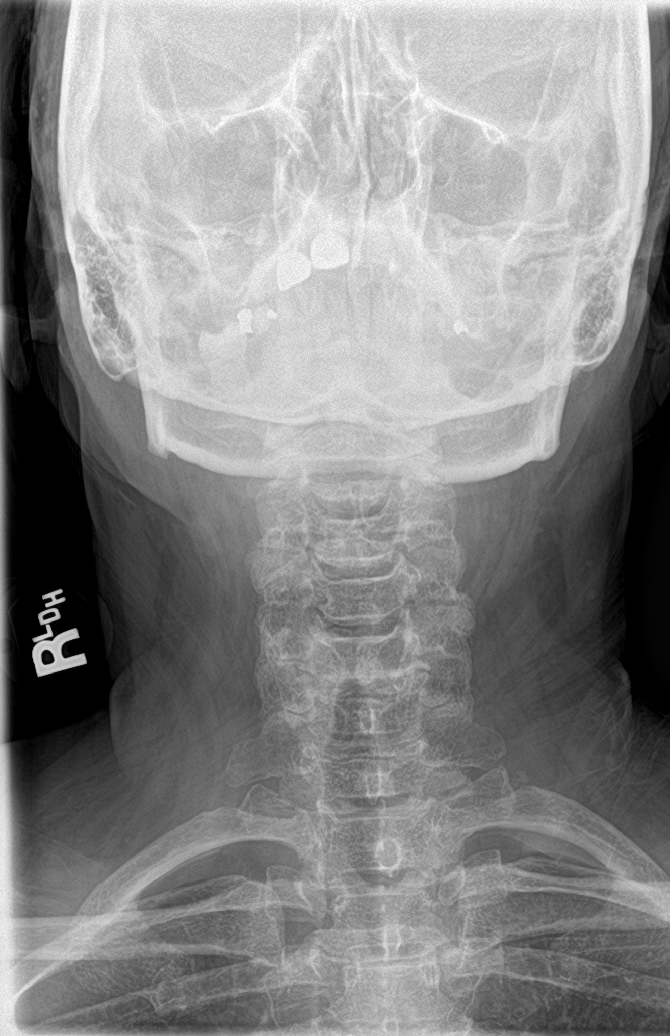
[im 5/5]
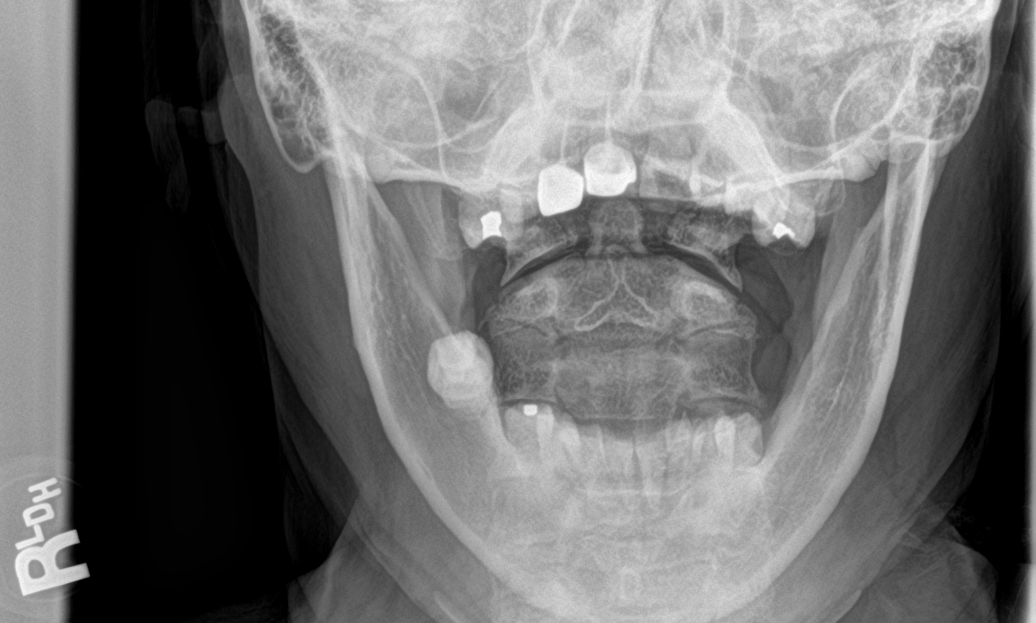

[5 of 5 positions shown; findings below may reference images not displayed]

FINDINGS: No fracture, bone lesion or spondylolisthesis.

Mild loss of disc height from C2-C3 through C4-C5. Moderate loss of
disc height at C5-C6 and C6-C7. Endplate spurring noted throughout
these levels. There is uncovertebral spurring causing moderate right
and left neural foraminal narrowing at C5-C6.

Soft tissues are unremarkable.
IMPRESSION: 1. No fracture or acute finding.
2. Significant disc degenerative change. Moderate neural foraminal
narrowing bilaterally at C5-C6.

## 2021-08-06 LAB — HM MAMMOGRAPHY

## 2021-08-10 ENCOUNTER — Telehealth: Payer: Self-pay | Admitting: Family

## 2021-08-10 NOTE — Telephone Encounter (Signed)
LMTCB office to get results from mammogram ?

## 2021-08-10 NOTE — Telephone Encounter (Signed)
Call patient ?Mammogram from [facility] is normal.  Repeat in 1 year ?

## 2021-08-13 NOTE — Telephone Encounter (Signed)
LMTCB for mammogram results if we do not here back patient is scheduled to be seen 3/21. ?

## 2021-08-18 ENCOUNTER — Encounter: Payer: 59 | Admitting: Family

## 2021-08-19 NOTE — Progress Notes (Signed)
This encounter was created in error - please disregard.

## 2021-08-22 ENCOUNTER — Other Ambulatory Visit: Payer: Self-pay | Admitting: Family

## 2021-08-22 DIAGNOSIS — F339 Major depressive disorder, recurrent, unspecified: Secondary | ICD-10-CM

## 2021-08-22 DIAGNOSIS — Z0000 Encounter for general adult medical examination without abnormal findings: Secondary | ICD-10-CM

## 2021-08-28 ENCOUNTER — Other Ambulatory Visit: Payer: Self-pay | Admitting: Family

## 2021-08-28 DIAGNOSIS — F339 Major depressive disorder, recurrent, unspecified: Secondary | ICD-10-CM

## 2021-10-23 ENCOUNTER — Ambulatory Visit (INDEPENDENT_AMBULATORY_CARE_PROVIDER_SITE_OTHER): Payer: 59 | Admitting: Family

## 2021-10-23 ENCOUNTER — Encounter: Payer: Self-pay | Admitting: Family

## 2021-10-23 VITALS — BP 128/80 | HR 99 | Temp 98.2°F | Ht 64.0 in | Wt 176.8 lb

## 2021-10-23 DIAGNOSIS — Z1231 Encounter for screening mammogram for malignant neoplasm of breast: Secondary | ICD-10-CM | POA: Diagnosis not present

## 2021-10-23 DIAGNOSIS — Z0000 Encounter for general adult medical examination without abnormal findings: Secondary | ICD-10-CM | POA: Diagnosis not present

## 2021-10-23 DIAGNOSIS — F339 Major depressive disorder, recurrent, unspecified: Secondary | ICD-10-CM

## 2021-10-23 DIAGNOSIS — Z01419 Encounter for gynecological examination (general) (routine) without abnormal findings: Secondary | ICD-10-CM | POA: Diagnosis not present

## 2021-10-23 DIAGNOSIS — Z1211 Encounter for screening for malignant neoplasm of colon: Secondary | ICD-10-CM | POA: Diagnosis not present

## 2021-10-23 NOTE — Patient Instructions (Signed)
Referral to GYN, Dr. Valentino Saxon, dermatology, and gastroenterology for colonoscopy Let us know if you dont hear back within a week in regards to an appointment being scheduled.   Nice to see you!  Health Maintenance for Postmenopausal Women Menopause is a normal process in which your ability to get pregnant comes to an end. This process happens slowly over many months or years, usually between the ages of 39 and 55. Menopause is complete when you have missed your menstrual period for 12 months. It is important to talk with your health care provider about some of the most common conditions that affect women after menopause (postmenopausal women). These include heart disease, cancer, and bone loss (osteoporosis). Adopting a healthy lifestyle and getting preventive care can help to promote your health and wellness. The actions you take can also lower your chances of developing some of these common conditions. What are the signs and symptoms of menopause? During menopause, you may have the following symptoms: Hot flashes. These can be moderate or severe. Night sweats. Decrease in sex drive. Mood swings. Headaches. Tiredness (fatigue). Irritability. Memory problems. Problems falling asleep or staying asleep. Talk with your health care provider about treatment options for your symptoms. Do I need hormone replacement therapy? Hormone replacement therapy is effective in treating symptoms that are caused by menopause, such as hot flashes and night sweats. Hormone replacement carries certain risks, especially as you become older. If you are thinking about using estrogen or estrogen with progestin, discuss the benefits and risks with your health care provider. How can I reduce my risk for heart disease and stroke? The risk of heart disease, heart attack, and stroke increases as you age. One of the causes may be a change in the body's hormones during menopause. This can affect how your body uses dietary  fats, triglycerides, and cholesterol. Heart attack and stroke are medical emergencies. There are many things that you can do to help prevent heart disease and stroke. Watch your blood pressure High blood pressure causes heart disease and increases the risk of stroke. This is more likely to develop in people who have high blood pressure readings or are overweight. Have your blood pressure checked: Every 3-5 years if you are 61-40 years of age. Every year if you are 18 years old or older. Eat a healthy diet  Eat a diet that includes plenty of vegetables, fruits, low-fat dairy products, and lean protein. Do not eat a lot of foods that are high in solid fats, added sugars, or sodium. Get regular exercise Get regular exercise. This is one of the most important things you can do for your health. Most adults should: Try to exercise for at least 150 minutes each week. The exercise should increase your heart rate and make you sweat (moderate-intensity exercise). Try to do strengthening exercises at least twice each week. Do these in addition to the moderate-intensity exercise. Spend less time sitting. Even light physical activity can be beneficial. Other tips Work with your health care provider to achieve or maintain a healthy weight. Do not use any products that contain nicotine or tobacco. These products include cigarettes, chewing tobacco, and vaping devices, such as e-cigarettes. If you need help quitting, ask your health care provider. Know your numbers. Ask your health care provider to check your cholesterol and your blood sugar (glucose). Continue to have your blood tested as directed by your health care provider. Do I need screening for cancer? Depending on your health history and family history, you may  need to have cancer screenings at different stages of your life. This may include screening for: Breast cancer. Cervical cancer. Lung cancer. Colorectal cancer. What is my risk for  osteoporosis? After menopause, you may be at increased risk for osteoporosis. Osteoporosis is a condition in which bone destruction happens more quickly than new bone creation. To help prevent osteoporosis or the bone fractures that can happen because of osteoporosis, you may take the following actions: If you are 10-50 years old, get at least 1,000 mg of calcium and at least 600 international units (IU) of vitamin D per day. If you are older than age 68 but younger than age 62, get at least 1,200 mg of calcium and at least 600 international units (IU) of vitamin D per day. If you are older than age 10, get at least 1,200 mg of calcium and at least 800 international units (IU) of vitamin D per day. Smoking and drinking excessive alcohol increase the risk of osteoporosis. Eat foods that are rich in calcium and vitamin D, and do weight-bearing exercises several times each week as directed by your health care provider. How does menopause affect my mental health? Depression may occur at any age, but it is more common as you become older. Common symptoms of depression include: Feeling depressed. Changes in sleep patterns. Changes in appetite or eating patterns. Feeling an overall lack of motivation or enjoyment of activities that you previously enjoyed. Frequent crying spells. Talk with your health care provider if you think that you are experiencing any of these symptoms. General instructions See your health care provider for regular wellness exams and vaccines. This may include: Scheduling regular health, dental, and eye exams. Getting and maintaining your vaccines. These include: Influenza vaccine. Get this vaccine each year before the flu season begins. Pneumonia vaccine. Shingles vaccine. Tetanus, diphtheria, and pertussis (Tdap) booster vaccine. Your health care provider may also recommend other immunizations. Tell your health care provider if you have ever been abused or do not feel safe at  home. Summary Menopause is a normal process in which your ability to get pregnant comes to an end. This condition causes hot flashes, night sweats, decreased interest in sex, mood swings, headaches, or lack of sleep. Treatment for this condition may include hormone replacement therapy. Take actions to keep yourself healthy, including exercising regularly, eating a healthy diet, watching your weight, and checking your blood pressure and blood sugar levels. Get screened for cancer and depression. Make sure that you are up to date with all your vaccines. This information is not intended to replace advice given to you by your health care provider. Make sure you discuss any questions you have with your health care provider. Document Revised: 10/06/2020 Document Reviewed: 10/06/2020 Elsevier Patient Education  [place].

## 2021-10-23 NOTE — Assessment & Plan Note (Signed)
Patient declines clinical breast exam.  She would like to see GYN ( referral to Dr Marcelline Mates) for her Pap smear as well.  Mammogram is up-to-date.  I placed referral for colonoscopy and dermatology for annual skin check.  Encouraged her to start a walking program

## 2021-10-23 NOTE — Assessment & Plan Note (Signed)
Excellent control at this time.  Patient will drop off FMLA paperwork in case of any missed work to protect her employment.  Continue Wellbutrin 300mg , Celexa 10mg 

## 2021-10-23 NOTE — Progress Notes (Signed)
Subjective:    Patient ID: Kimberly Mclaughlin, female    DOB: 1961/04/14, 61 y.o.   MRN: [id number]  CC: Kimberly Mclaughlin is a 61 y.o. female who presents today for physical exam.    HPI: Feels well today.  No new complaints.  She feels more level and at ease than she has in the past.  She feels Wellbutrin 300mg , Celexa 10mg   are working very well for her.  No increased anxiety or depression.  No thoughts of hurting herself or anyone else.  She would like to continue to keep an FMLA form on file with her employer in the case of any miss work due to depression.   Colorectal Cancer Screening: due Breast Cancer Screening: Mammogram UTD, 08/06/21 at [facility] Cervical Cancer Screening: due; she would like to see Dr 10/06/21.  Bone Health screening/DEXA for 65+: No increased fracture risk. Defer screening at this time.  Lung Cancer Screening: Doesn't have 20 year pack year history and age > 43 years yo 40 years         Tetanus - UTD        Labs: Screening labs today. Exercise: No regular exercise.  She has a plan to start exercise with walking.  Alcohol use:  none Smoking/tobacco use: Nonsmoker.     HISTORY:  Past Medical History:  Diagnosis Date   Depression    Hypertension     History reviewed. No pertinent surgical history. Family History  Problem Relation Age of Onset   Cancer Mother 53       breast   Heart disease Maternal Grandmother    Hypertension Maternal Grandmother    Diabetes Maternal Grandmother    Heart disease Maternal Grandfather    Hypertension Maternal Grandfather    Diabetes Maternal Grandfather    Colon cancer Neg Hx       ALLERGIES: Patient has no known allergies.  Current Outpatient Medications on File Prior to Visit  Medication Sig Dispense Refill   amLODipine (NORVASC) 5 MG tablet Take 1 tablet (5 mg total) by mouth daily. 30 tablet 4   buPROPion (WELLBUTRIN XL) 300 MG 24 hr tablet TAKE 1 TABLET(300 MG) BY MOUTH IN THE MORNING 90 tablet 3   citalopram  (CELEXA) 10 MG tablet TAKE 1 TABLET(10 MG) BY MOUTH DAILY 90 tablet 1   lisinopril-hydrochlorothiazide (ZESTORETIC) 20-12.5 MG tablet Take 1 tablet by mouth daily. 90 tablet 3   rosuvastatin (CRESTOR) 5 MG tablet Take 1 tablet (5 mg total) by mouth every evening. 90 tablet 3   No current facility-administered medications on file prior to visit.    Social History   Tobacco Use   Smoking status: Former   Smokeless tobacco: Never   Tobacco comments:    smoked cigarretes in highschool  Substance Use Topics   Alcohol use: No   Drug use: No    Review of Systems  Constitutional:  Negative for chills and fever.  Respiratory:  Negative for cough.   Cardiovascular:  Negative for chest pain and palpitations.  Gastrointestinal:  Negative for nausea and vomiting.  Psychiatric/Behavioral:  The patient is not nervous/anxious.      Objective:    BP 128/80   Pulse 99   Temp 98.2 F (36.8 C) (Oral)   Ht 5\' 4"  (1.626 m)   Wt 176 lb 12.8 oz (80.2 kg)   SpO2 98%   BMI 30.35 kg/m   BP Readings from Last 3 Encounters:  10/23/21 128/80  09/30/20 (!) 148/108  08/22/20 138/90  Wt Readings from Last 3 Encounters:  10/23/21 176 lb 12.8 oz (80.2 kg)  03/17/21 176 lb (79.8 kg)  09/30/20 175 lb (79.4 kg)    Physical Exam Vitals reviewed.  Constitutional:      Appearance: She is well-developed.  Eyes:     Conjunctiva/sclera: Conjunctivae normal.  Neck:     Thyroid: No thyroid mass or thyromegaly.  Cardiovascular:     Rate and Rhythm: Normal rate and regular rhythm.     Pulses: Normal pulses.     Heart sounds: Normal heart sounds.  Pulmonary:     Effort: Pulmonary effort is normal.     Breath sounds: Normal breath sounds. No wheezing, rhonchi or rales.  Lymphadenopathy:     Head:     Right side of head: No submental, submandibular, tonsillar, preauricular, posterior auricular or occipital adenopathy.     Left side of head: No submental, submandibular, tonsillar, preauricular,  posterior auricular or occipital adenopathy.     Cervical: No cervical adenopathy.  Skin:    General: Skin is warm and dry.  Neurological:     Mental Status: She is alert.  Psychiatric:        Speech: Speech normal.        Behavior: Behavior normal.        Thought Content: Thought content normal.       Assessment & Plan:   Problem List Items Addressed This Visit       Other   Depression, recurrent (HCC)    Excellent control at this time.  Patient will drop off FMLA paperwork in case of any missed work to protect her employment.  Continue Wellbutrin 300mg , Celexa 10mg         Routine physical examination - Primary    Patient declines clinical breast exam.  She would like to see GYN ( referral to Dr ) for her Pap smear as well.  Mammogram is up-to-date.  I placed referral for colonoscopy and dermatology for annual skin check.  Encouraged her to start a walking program       Relevant Orders   Ambulatory referral to Dermatology   Other Visit Diagnoses     Encounter for screening mammogram for malignant neoplasm of breast       Women's annual routine gynecological examination       Relevant Orders   Ambulatory referral to Obstetrics / Gynecology   Screen for colon cancer       Relevant Orders   Ambulatory referral to Gastroenterology        I am having maintain her amLODipine, lisinopril-hydrochlorothiazide, rosuvastatin, buPROPion, and citalopram.   No orders of the defined types were placed in this encounter.   Return precautions given.   Risks, benefits, and alternatives of the medications and treatment plan prescribed today were discussed, and patient expressed understanding.   Education regarding symptom management and diagnosis given to patient on AVS.   Continue to follow with Valentino Saxon, FNP for routine health maintenance.   Renne Crigler and I agreed with plan.   Allegra Grana, FNP

## 2021-10-27 ENCOUNTER — Telehealth: Payer: Self-pay | Admitting: Family

## 2021-10-27 ENCOUNTER — Telehealth: Payer: Self-pay

## 2021-10-27 NOTE — Telephone Encounter (Signed)
Patient dropped off FMLA paper work. 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

## 2021-10-27 NOTE — Telephone Encounter (Signed)
Kimberly Mclaughlin, patient ask you to look over FMLA Paper work

## 2021-10-27 NOTE — Telephone Encounter (Signed)
Called patient she will call us back to schedule

## 2021-10-29 ENCOUNTER — Telehealth: Payer: Self-pay

## 2021-10-29 NOTE — Telephone Encounter (Signed)
CALLED PATIENT NO ANSWER LEFT VOICEMAIL FOR A CALL BACK °Letter sent °

## 2021-11-02 ENCOUNTER — Other Ambulatory Visit: Payer: Self-pay

## 2021-11-02 ENCOUNTER — Telehealth: Payer: Self-pay

## 2021-11-02 DIAGNOSIS — R5383 Other fatigue: Secondary | ICD-10-CM

## 2021-11-02 DIAGNOSIS — E785 Hyperlipidemia, unspecified: Secondary | ICD-10-CM

## 2021-11-02 DIAGNOSIS — I10 Essential (primary) hypertension: Secondary | ICD-10-CM

## 2021-11-02 NOTE — Telephone Encounter (Signed)
Patient states she would like to schedule an appointment for her blood to be drawn, but we do not have orders for labs in the system.  Patient states she would like to have an appointment at 11:30am on 11/06/2021.

## 2021-11-02 NOTE — Telephone Encounter (Signed)
Spoke to patient and ordered labs and scheduled appointment to get bloodwork done for 11/06/21 @ 11am

## 2021-11-04 NOTE — Telephone Encounter (Signed)
Updated fax # [phone].. or email FMLA paperwork to [employer].

## 2021-11-04 NOTE — Telephone Encounter (Signed)
Pt called about FMLA paperwork

## 2021-11-05 NOTE — Telephone Encounter (Signed)
Spoke to patient in regards to her FMLA paperwork that was faxed on 11/02/21 and on 11/04/21 but they still had not received it.Patient stated that the fax number may not work but she would find out how I could get it to them and give me a call back to get it to them.

## 2021-11-06 ENCOUNTER — Other Ambulatory Visit: Payer: 59

## 2021-11-20 ENCOUNTER — Telehealth: Payer: Self-pay | Admitting: Obstetrics & Gynecology

## 2021-11-20 NOTE — Telephone Encounter (Signed)
Pt called in and has a referral from [facility].  She was calling to get scheduled.

## 2021-11-24 ENCOUNTER — Telehealth: Payer: Self-pay | Admitting: Family Medicine

## 2021-11-25 NOTE — Telephone Encounter (Signed)
Called and left voicemail for patient to call back to be scheduled. 

## 2021-11-25 NOTE — Telephone Encounter (Signed)
Patient is scheduled for 12/22/21 with [facility]

## 2021-12-08 ENCOUNTER — Other Ambulatory Visit (INDEPENDENT_AMBULATORY_CARE_PROVIDER_SITE_OTHER): Payer: 59

## 2021-12-08 DIAGNOSIS — E785 Hyperlipidemia, unspecified: Secondary | ICD-10-CM

## 2021-12-08 DIAGNOSIS — I10 Essential (primary) hypertension: Secondary | ICD-10-CM | POA: Diagnosis not present

## 2021-12-08 DIAGNOSIS — R5383 Other fatigue: Secondary | ICD-10-CM | POA: Diagnosis not present

## 2021-12-08 LAB — CBC WITH DIFFERENTIAL/PLATELET
Basophils Absolute: 0.1 10*3/uL (ref 0.0–0.1)
Basophils Relative: 1 % (ref 0.0–3.0)
Eosinophils Absolute: 0.1 10*3/uL (ref 0.0–0.7)
Eosinophils Relative: 1.4 % (ref 0.0–5.0)
HCT: 45.6 % (ref 36.0–46.0)
Hemoglobin: 14.7 g/dL (ref 12.0–15.0)
Lymphocytes Relative: 28.8 % (ref 12.0–46.0)
Lymphs Abs: 2.6 10*3/uL (ref 0.7–4.0)
MCHC: 32.2 g/dL (ref 30.0–36.0)
MCV: 88.7 fl (ref 78.0–100.0)
Monocytes Absolute: 0.7 10*3/uL (ref 0.1–1.0)
Monocytes Relative: 8.2 % (ref 3.0–12.0)
Neutro Abs: 5.5 10*3/uL (ref 1.4–7.7)
Neutrophils Relative %: 60.6 % (ref 43.0–77.0)
Platelets: 336 10*3/uL (ref 150.0–400.0)
RBC: 5.15 Mil/uL — ABNORMAL HIGH (ref 3.87–5.11)
RDW: 15 % (ref 11.5–15.5)
WBC: 9 10*3/uL (ref 4.0–10.5)

## 2021-12-08 LAB — LIPID PANEL
Cholesterol: 198 mg/dL (ref 0–200)
HDL: 62 mg/dL (ref >39.00)
LDL Cholesterol: 116 mg/dL — ABNORMAL HIGH (ref 0–99)
NonHDL: 136.19
Total CHOL/HDL Ratio: 3
Triglycerides: 101 mg/dL (ref 0.0–149.0)
VLDL: 20.2 mg/dL (ref 0.0–40.0)

## 2021-12-08 LAB — COMPREHENSIVE METABOLIC PANEL
ALT: 14 U/L (ref 0–35)
AST: 15 U/L (ref 0–37)
Albumin: 4.7 g/dL (ref 3.5–5.2)
Alkaline Phosphatase: 89 U/L (ref 39–117)
BUN: 12 mg/dL (ref 6–23)
CO2: 28 mEq/L (ref 19–32)
Calcium: 10 mg/dL (ref 8.4–10.5)
Chloride: 103 mEq/L (ref 96–112)
Creatinine, Ser: 0.75 mg/dL (ref 0.40–1.20)
GFR: 86.51 mL/min (ref >60.00)
Glucose, Bld: 90 mg/dL (ref 70–99)
Potassium: 3.9 mEq/L (ref 3.5–5.1)
Sodium: 141 mEq/L (ref 135–145)
Total Bilirubin: 0.4 mg/dL (ref 0.2–1.2)
Total Protein: 7.8 g/dL (ref 6.0–8.3)

## 2021-12-08 LAB — VITAMIN D 25 HYDROXY (VIT D DEFICIENCY, FRACTURES): VITD: 19.8 ng/mL — ABNORMAL LOW (ref 30.00–100.00)

## 2021-12-08 LAB — HEMOGLOBIN A1C: Hgb A1c MFr Bld: 6.3 % (ref 4.6–6.5)

## 2021-12-08 LAB — TSH: TSH: 0.69 u[IU]/mL (ref 0.35–5.50)

## 2021-12-09 ENCOUNTER — Other Ambulatory Visit: Payer: Self-pay | Admitting: Family

## 2021-12-09 ENCOUNTER — Other Ambulatory Visit: Payer: Self-pay

## 2021-12-09 DIAGNOSIS — E785 Hyperlipidemia, unspecified: Secondary | ICD-10-CM

## 2021-12-09 DIAGNOSIS — E559 Vitamin D deficiency, unspecified: Secondary | ICD-10-CM

## 2021-12-09 MED ORDER — ROSUVASTATIN CALCIUM 10 MG PO TABS: 10.0000 mg | ORAL_TABLET | Freq: Every day | ORAL | 3 refills | Status: DC

## 2021-12-09 MED ORDER — CHOLECALCIFEROL 1.25 MG (50000 UT) PO TABS: ORAL_TABLET | ORAL | 0 refills | Status: DC

## 2021-12-22 ENCOUNTER — Encounter: Payer: 59 | Admitting: Family Medicine

## 2021-12-28 ENCOUNTER — Telehealth: Payer: Self-pay | Admitting: *Deleted

## 2021-12-28 DIAGNOSIS — I10 Essential (primary) hypertension: Secondary | ICD-10-CM

## 2021-12-28 NOTE — Telephone Encounter (Signed)
Please place future orders for lab appt.    Note: Pt may be scheduled to early for labs if she is supposed to come back in 6 weeks.    Jenate [place], [facility]  12/09/2021  1:27 PM EDT Back to Top    Spoke to patient in detail about result note and she verbalized understanding. Scheduled 6 wk appt for CMP and also scheduled  3-4 month appt for recheck of Vitamin D. Patient also agreed to increase of Crestor from 5mg  to 10 mg

## 2021-12-28 NOTE — Telephone Encounter (Signed)
Rescheduled appt for 01/22/22

## 2021-12-28 NOTE — Telephone Encounter (Signed)
close

## 2022-01-08 ENCOUNTER — Other Ambulatory Visit: Payer: 59

## 2022-01-11 ENCOUNTER — Encounter: Payer: 59 | Admitting: Obstetrics & Gynecology

## 2022-01-12 ENCOUNTER — Ambulatory Visit (INDEPENDENT_AMBULATORY_CARE_PROVIDER_SITE_OTHER): Payer: 59 | Admitting: Obstetrics and Gynecology

## 2022-01-12 ENCOUNTER — Encounter: Payer: Self-pay | Admitting: Obstetrics and Gynecology

## 2022-01-12 VITALS — BP 128/80 | Ht 64.0 in | Wt 182.0 lb

## 2022-01-12 DIAGNOSIS — B356 Tinea cruris: Secondary | ICD-10-CM | POA: Diagnosis not present

## 2022-01-12 DIAGNOSIS — F419 Anxiety disorder, unspecified: Secondary | ICD-10-CM

## 2022-01-12 LAB — POCT WET PREP WITH KOH
Clue Cells Wet Prep HPF POC: NEGATIVE
KOH Prep POC: NEGATIVE
Trichomonas, UA: NEGATIVE
Yeast Wet Prep HPF POC: NEGATIVE

## 2022-01-12 MED ORDER — ALPRAZOLAM 0.25 MG PO TABS: ORAL_TABLET | ORAL | 0 refills | Status: DC

## 2022-01-12 MED ORDER — FLUCONAZOLE 150 MG PO TABS: ORAL_TABLET | ORAL | 0 refills | Status: AC

## 2022-01-12 MED ORDER — TRIAMCINOLONE ACETONIDE 0.1 % EX CREA: TOPICAL_CREAM | Freq: Every day | CUTANEOUS | 1 refills | Status: AC

## 2022-01-12 NOTE — Progress Notes (Signed)
Allegra Grana, FNP   Chief Complaint  Patient presents with   Vaginal Rash    Going down into both legs, itchy x 1 month    HPI:      [name] is a 61 y.o. No obstetric history on file. whose LMP was No LMP recorded. Patient is postmenopausal., presents today for NP ref from PCP for pap; also self referral for issues with vaginal rash.  Pt declines pap today but plans to come back another time. Has anxiety over Gyn exam.  Pt has had external itching in bilat groin and now vulvar for a month; is now going down her legs. Had changed soaps prior to sx. No prior abx use, no meds to treat. Is embarrassed by sx. No increased vag d/c/odor.  She is not sexually active.   Patient Active Problem List   Diagnosis Date Noted   HLD (hyperlipidemia) 03/17/2021   Left shoulder pain 06/25/2019   Rash 03/15/2017   Depression, recurrent (HCC) 03/15/2017   Essential hypertension 03/15/2017   Routine physical examination 03/15/2017    History reviewed. No pertinent surgical history.  Family History  Problem Relation Age of Onset   Cancer Mother 49       breast   Heart disease Maternal Grandmother    Hypertension Maternal Grandmother    Diabetes Maternal Grandmother    Heart disease Maternal Grandfather    Hypertension Maternal Grandfather    Diabetes Maternal Grandfather    Colon cancer Neg Hx     Social History   Socioeconomic History   Marital status: Single    Spouse name: Not on file   Number of children: Not on file   Years of education: Not on file   Highest education level: Not on file  Occupational History   Not on file  Tobacco Use   Smoking status: Former   Smokeless tobacco: Never   Tobacco comments:    smoked cigarretes in [employer] Use   Vaping Use: Never used  Substance and Sexual Activity   Alcohol use: No   Drug use: No   Sexual activity: Not Currently    Partners: Male    Birth control/protection: None  Other Topics Concern    Not on file  Social History Narrative   Works for [employer], chemistry, staining slides.    Social Determinants of Health   Financial Resource Strain: Not on file  Food Insecurity: Not on file  Transportation Needs: Not on file  Physical Activity: Not on file  Stress: Not on file  Social Connections: Not on file  Intimate Partner Violence: Not on file    Outpatient Medications Prior to Visit  Medication Sig Dispense Refill   amLODipine (NORVASC) 5 MG tablet Take 1 tablet (5 mg total) by mouth daily. 30 tablet 4   buPROPion (WELLBUTRIN XL) 300 MG 24 hr tablet TAKE 1 TABLET(300 MG) BY MOUTH IN THE MORNING 90 tablet 3   Cholecalciferol 1.25 MG (50000 UT) TABS 50,000 units PO qwk for 8 weeks. 8 tablet 0   citalopram (CELEXA) 10 MG tablet TAKE 1 TABLET(10 MG) BY MOUTH DAILY 90 tablet 1   lisinopril-hydrochlorothiazide (ZESTORETIC) 20-12.5 MG tablet Take 1 tablet by mouth daily. 90 tablet 3   rosuvastatin (CRESTOR) 10 MG tablet Take 1 tablet (10 mg total) by mouth daily. 90 tablet 3   No facility-administered medications prior to visit.      ROS:  Review of Systems  Constitutional:  Negative for  fever.  Gastrointestinal:  Negative for blood in stool, constipation, diarrhea, nausea and vomiting.  Genitourinary:  Negative for dyspareunia, dysuria, flank pain, frequency, hematuria, urgency, vaginal bleeding, vaginal discharge and vaginal pain.  Musculoskeletal:  Negative for back pain.  Skin:  Positive for rash.   BREAST: No symptoms   OBJECTIVE:   Vitals:  BP 128/80   Ht 5\' 4"  (1.626 m)   Wt 182 lb (82.6 kg)   BMI 31.24 kg/m   Physical Exam Vitals reviewed.  Constitutional:      Appearance: She is well-developed.  Pulmonary:     Effort: Pulmonary effort is normal.  Genitourinary:    Pubic Area: No rash.      Labia:        Right: Rash present. No tenderness or lesion.        Left: Rash present. No tenderness or lesion.      Vagina: Vaginal discharge present. No  erythema or tenderness.       Comments: HYPERPIGMENTED AND HYPERTROPHIC RASH WITH SCALY BORDERS AND AREAS OF PALE SKIN; NO D/C.  PT REFUSES VAGINAL EXAM; QTIP USED TO GET VAG D/C Musculoskeletal:        General: Normal range of motion.     Cervical back: Normal range of motion.  Skin:    General: Skin is warm and dry.  Neurological:     General: No focal deficit present.     Mental Status: She is alert and oriented to person, place, and time.  Psychiatric:        Mood and Affect: Mood normal.        Behavior: Behavior normal.        Thought Content: Thought content normal.        Judgment: Judgment normal.     Results: Results for orders placed or performed in visit on 01/12/22 (from the past 24 hour(s))  POCT Wet Prep with KOH     Status: Normal   Collection Time: 01/12/22  4:45 PM  Result Value Ref Range   Trichomonas, UA Negative    Clue Cells Wet Prep HPF POC neg    Epithelial Wet Prep HPF POC     Yeast Wet Prep HPF POC neg    Bacteria Wet Prep HPF POC     RBC Wet Prep HPF POC     WBC Wet Prep HPF POC     KOH Prep POC Negative Negative     Assessment/Plan: Tinea cruris - Plan: fluconazole (DIFLUCAN) 150 MG tablet, ketoconazole 2%-triamcinolone 0.1% 1:2 cream mixture, POCT Wet Prep with KOH; neg wet prep. Rx diflucan and ketoconazole; cool compresses, change back to Dove sens skin soap, keep dry. RTO in 2 wks for f/u. May need to extend ketoconazole tx.   Anxiety - Plan: ALPRAZolam (XANAX) 0.25 MG tablet; Rx xanax to take 1 hr before f/u appt in 2 wks for pap smear.    Meds ordered this encounter  Medications   fluconazole (DIFLUCAN) 150 MG tablet    Sig: Take 1 tab weekly for 4 weeks    Dispense:  4 tablet    Refill:  0    Order Specific Question:   Supervising Provider    Answer:   01/14/22 [[id number]]   ketoconazole 2%-triamcinolone 0.1% 1:2 cream mixture    Sig: Apply topically daily for 14 days.    Dispense:  45 g    Refill:  1    Order Specific  Question:   Supervising Provider  Answer:   Hildred Laser [[id number]]   ALPRAZolam (XANAX) 0.25 MG tablet    Sig: Take 1 pill 1 hr before procedure    Dispense:  1 tablet    Refill:  0    Order Specific Question:   Supervising Provider    Answer:   Hildred Laser [[id number]]      Return in about 2 weeks (around 01/26/2022) for f/u, pap smear.  Finnbar Cedillos B. Lonetta Blassingame, PA-C 01/12/2022 4:45 PM

## 2022-01-13 ENCOUNTER — Encounter: Payer: 59 | Admitting: Obstetrics & Gynecology

## 2022-01-22 ENCOUNTER — Other Ambulatory Visit: Payer: 59

## 2022-02-02 ENCOUNTER — Encounter: Payer: 59 | Admitting: Obstetrics and Gynecology

## 2022-02-02 NOTE — Progress Notes (Deleted)
Kimberly Grana, FNP   No chief complaint on file.   HPI:      [name] is a 61 y.o. G0P0000 whose LMP was No LMP recorded. Patient is postmenopausal., presents today for pap smear, referred by PCP last visit but pt has anxiety over exam and wanted to come back. Diagnosed with tinea cruris 2 wks ago, treated with Rx diflucan and ketoconazole; cool compresses, change back to Dove sens skin soap, keep dry. RTO in 2 wks for f/u. May need to extend ketoconazole tx.   Patient Active Problem List   Diagnosis Date Noted   HLD (hyperlipidemia) 03/17/2021   Left shoulder pain 06/25/2019   Rash 03/15/2017   Depression, recurrent (HCC) 03/15/2017   Essential hypertension 03/15/2017   Routine physical examination 03/15/2017    No past surgical history on file.  Family History  Problem Relation Age of Onset   Cancer Mother 73       breast   Heart disease Maternal Grandmother    Hypertension Maternal Grandmother    Diabetes Maternal Grandmother    Heart disease Maternal Grandfather    Hypertension Maternal Grandfather    Diabetes Maternal Grandfather    Colon cancer Neg Hx     Social History   Socioeconomic History   Marital status: Single    Spouse name: Not on file   Number of children: Not on file   Years of education: Not on file   Highest education level: Not on file  Occupational History   Not on file  Tobacco Use   Smoking status: Former   Smokeless tobacco: Never   Tobacco comments:    smoked cigarretes in [employer] Use   Vaping Use: Never used  Substance and Sexual Activity   Alcohol use: No   Drug use: No   Sexual activity: Not Currently    Partners: Male    Birth control/protection: None  Other Topics Concern   Not on file  Social History Narrative   Works for [employer], chemistry, staining slides.    Social Determinants of Health   Financial Resource Strain: Not on file  Food Insecurity: Not on file  Transportation Needs: Not  on file  Physical Activity: Not on file  Stress: Not on file  Social Connections: Not on file  Intimate Partner Violence: Not on file    Outpatient Medications Prior to Visit  Medication Sig Dispense Refill   ALPRAZolam (XANAX) 0.25 MG tablet Take 1 pill 1 hr before procedure 1 tablet 0   amLODipine (NORVASC) 5 MG tablet Take 1 tablet (5 mg total) by mouth daily. 30 tablet 4   buPROPion (WELLBUTRIN XL) 300 MG 24 hr tablet TAKE 1 TABLET(300 MG) BY MOUTH IN THE MORNING 90 tablet 3   Cholecalciferol 1.25 MG (50000 UT) TABS 50,000 units PO qwk for 8 weeks. 8 tablet 0   citalopram (CELEXA) 10 MG tablet TAKE 1 TABLET(10 MG) BY MOUTH DAILY 90 tablet 1   fluconazole (DIFLUCAN) 150 MG tablet Take 1 tab weekly for 4 weeks 4 tablet 0   lisinopril-hydrochlorothiazide (ZESTORETIC) 20-12.5 MG tablet Take 1 tablet by mouth daily. 90 tablet 3   rosuvastatin (CRESTOR) 10 MG tablet Take 1 tablet (10 mg total) by mouth daily. 90 tablet 3   No facility-administered medications prior to visit.      ROS:  Review of Systems BREAST: No symptoms   OBJECTIVE:   Vitals:  There were no vitals taken for this visit.  Physical Exam  Results: No results found for this or any previous visit (from the past 24 hour(s)).   Assessment/Plan: No diagnosis found.    No orders of the defined types were placed in this encounter.     No follow-ups on file.  Fountain Derusha B. Can Lucci, PA-C 02/02/2022 12:09 PM

## 2022-03-10 ENCOUNTER — Other Ambulatory Visit: Payer: Self-pay | Admitting: Family

## 2022-03-10 DIAGNOSIS — F339 Major depressive disorder, recurrent, unspecified: Secondary | ICD-10-CM

## 2022-03-10 NOTE — Telephone Encounter (Signed)
Rx sent pt is aware 

## 2022-04-09 ENCOUNTER — Encounter: Payer: Self-pay | Admitting: Family

## 2022-04-09 ENCOUNTER — Ambulatory Visit: Payer: 59 | Admitting: Family

## 2022-04-09 VITALS — BP 140/78 | HR 71 | Temp 98.2°F | Ht 64.0 in | Wt 175.2 lb

## 2022-04-09 DIAGNOSIS — F339 Major depressive disorder, recurrent, unspecified: Secondary | ICD-10-CM

## 2022-04-09 DIAGNOSIS — I10 Essential (primary) hypertension: Secondary | ICD-10-CM

## 2022-04-09 DIAGNOSIS — Z8639 Personal history of other endocrine, nutritional and metabolic disease: Secondary | ICD-10-CM | POA: Diagnosis not present

## 2022-04-09 DIAGNOSIS — Z87898 Personal history of other specified conditions: Secondary | ICD-10-CM

## 2022-04-09 DIAGNOSIS — E785 Hyperlipidemia, unspecified: Secondary | ICD-10-CM

## 2022-04-09 MED ORDER — AMLODIPINE BESYLATE 5 MG PO TABS: 5.0000 mg | ORAL_TABLET | Freq: Every day | ORAL | 3 refills | Status: DC

## 2022-04-09 MED ORDER — ROSUVASTATIN CALCIUM 10 MG PO TABS: 10.0000 mg | ORAL_TABLET | Freq: Every day | ORAL | 3 refills | Status: DC

## 2022-04-09 NOTE — Assessment & Plan Note (Signed)
Chronic, pleased to see much improved today.Continue Wellbutrin 300 mg, Celexa 10 mg

## 2022-04-09 NOTE — Assessment & Plan Note (Signed)
Slightly elevated today.  Patient had run out of amlodipine 5 mg.  I refilled today.  Continue lisinopril- hctz 20-12.5mg 

## 2022-04-09 NOTE — Progress Notes (Signed)
Subjective:    Patient ID: Kimberly Mclaughlin, female    DOB: 1960-09-24, 61 y.o.   MRN: [id number]  CC: Kimberly Mclaughlin is a 61 y.o. female who presents today for follow up.   HPI: Feels well today.  She feels much better and has not had to use FMLA in regards to missing work for depression.   Depression-compliant Wellbutrin 300 mg, Celexa 10 mg .  She is pleased with regimen.  HTN- she ran out of amlodipine and needs a refill. She is compliant with lisinopril- hctz 20-12.5mg   She did not get increased dose of Crestor 10 mg.  She has been taking Crestor 5mg   Overdue for pap smear, she plans to call to schedule with Dr HISTORY:  Past Medical History:  Diagnosis Date   Depression    Hypertension    No past surgical history on file. Family History  Problem Relation Age of Onset   Cancer Mother 37       breast   Heart disease Maternal Grandmother    Hypertension Maternal Grandmother    Diabetes Maternal Grandmother    Heart disease Maternal Grandfather    Hypertension Maternal Grandfather    Diabetes Maternal Grandfather    Colon cancer Neg Hx     Allergies: Patient has no known allergies. Current Outpatient Medications on File Prior to Visit  Medication Sig Dispense Refill   buPROPion (WELLBUTRIN XL) 300 MG 24 hr tablet TAKE 1 TABLET(300 MG) BY MOUTH IN THE MORNING 90 tablet 3   Cholecalciferol 1.25 MG (50000 UT) TABS 50,000 units PO qwk for 8 weeks. 8 tablet 0   citalopram (CELEXA) 10 MG tablet TAKE 1 TABLET(10 MG) BY MOUTH DAILY 90 tablet 1   fluconazole (DIFLUCAN) 150 MG tablet Take 1 tab weekly for 4 weeks 4 tablet 0   lisinopril-hydrochlorothiazide (ZESTORETIC) 20-12.5 MG tablet Take 1 tablet by mouth daily. 90 tablet 3   ALPRAZolam (XANAX) 0.25 MG tablet Take 1 pill 1 hr before procedure (Patient not taking: Reported on 04/09/2022) 1 tablet 0   No current facility-administered medications on file prior to visit.    Social History   Tobacco Use   Smoking  status: Former   Smokeless tobacco: Never   Tobacco comments:    smoked cigarretes in 13/02/2022 Use   Vaping Use: Never used  Substance Use Topics   Alcohol use: No   Drug use: No    Review of Systems  Constitutional:  Negative for chills and fever.  Respiratory:  Negative for cough.   Cardiovascular:  Negative for chest pain and palpitations.  Gastrointestinal:  Negative for nausea and vomiting.      Objective:    BP (!) 140/78 (BP Location: Left Arm, Patient Position: Sitting, Cuff Size: Normal)   Pulse 71   Temp 98.2 F (36.8 C) (Oral)   Ht 5\' 4"  (1.626 m)   Wt 175 lb 3.2 oz (79.5 kg)   SpO2 96%   BMI 30.07 kg/m  BP Readings from Last 3 Encounters:  04/09/22 (!) 140/78  01/12/22 128/80  10/23/21 128/80   Wt Readings from Last 3 Encounters:  04/09/22 175 lb 3.2 oz (79.5 kg)  01/12/22 182 lb (82.6 kg)  10/23/21 176 lb 12.8 oz (80.2 kg)    Physical Exam Vitals reviewed.  Constitutional:      Appearance: She is well-developed.  Eyes:     Conjunctiva/sclera: Conjunctivae normal.  Cardiovascular:     Rate and Rhythm: Normal rate and regular  rhythm.     Pulses: Normal pulses.     Heart sounds: Normal heart sounds.  Pulmonary:     Effort: Pulmonary effort is normal.     Breath sounds: Normal breath sounds. No wheezing, rhonchi or rales.  Skin:    General: Skin is warm and dry.  Neurological:     Mental Status: She is alert.  Psychiatric:        Speech: Speech normal.        Behavior: Behavior normal.        Thought Content: Thought content normal.        Assessment & Plan:   Problem List Items Addressed This Visit       Cardiovascular and Mediastinum   Essential hypertension - Primary    Slightly elevated today.  Patient had run out of amlodipine 5 mg.  I refilled today.  Continue lisinopril- hctz 20-12.5mg       Relevant Medications   amLODipine (NORVASC) 5 MG tablet   rosuvastatin (CRESTOR) 10 MG tablet   Other Relevant Orders   CBC  with Differential/Platelet   Comprehensive metabolic panel     Other   Depression, recurrent (HCC)    Chronic, pleased to see much improved today.Continue Wellbutrin 300 mg, Celexa 10 mg       HLD (hyperlipidemia)    Based on last lipid panel, patient will increase Crestor from 5 mg to 10 mg.  We will obtain lipid panel in the near year.       Relevant Medications   amLODipine (NORVASC) 5 MG tablet   rosuvastatin (CRESTOR) 10 MG tablet   Other Visit Diagnoses     History of vitamin D deficiency       Relevant Orders   VITAMIN D 25 Hydroxy (Vit-D Deficiency, Fractures)   B12 and Folate Panel   History of prediabetes       Relevant Orders   Hemoglobin A1c   B12 and Folate Panel        I am having Kimberly Mclaughlin maintain her lisinopril-hydrochlorothiazide, buPROPion, Cholecalciferol, fluconazole, ALPRAZolam, citalopram, amLODipine, and rosuvastatin.   Meds ordered this encounter  Medications   amLODipine (NORVASC) 5 MG tablet    Sig: Take 1 tablet (5 mg total) by mouth daily.    Dispense:  90 tablet    Refill:  3    Order Specific Question:   Supervising Provider    Answer:   Duncan Dull L [[id number]]   rosuvastatin (CRESTOR) 10 MG tablet    Sig: Take 1 tablet (10 mg total) by mouth daily.    Dispense:  90 tablet    Refill:  3    Order Specific Question:   Supervising Provider    Answer:   Sherlene Shams [[id number]]    Return precautions given.   Risks, benefits, and alternatives of the medications and treatment plan prescribed today were discussed, and patient expressed understanding.   Education regarding symptom management and diagnosis given to patient on AVS.  Continue to follow with Allegra Grana, FNP for routine health maintenance.   Kimberly Mclaughlin and I agreed with plan.   Kimberly Plowman, FNP

## 2022-04-09 NOTE — Assessment & Plan Note (Signed)
Based on last lipid panel, patient will increase Crestor from 5 mg to 10 mg.  We will obtain lipid panel in the near year.

## 2022-04-10 ENCOUNTER — Other Ambulatory Visit: Payer: Self-pay | Admitting: Family

## 2022-04-10 DIAGNOSIS — I10 Essential (primary) hypertension: Secondary | ICD-10-CM

## 2022-04-12 ENCOUNTER — Telehealth: Payer: Self-pay

## 2022-04-12 NOTE — Telephone Encounter (Signed)
Patient dropped off FMLA paperwork to be completed by [name], FNP.  Patient states we may call her if we have any questions.  Paperwork is in [name], FNP's color folder up front.  The iPayment system was not working at the time of drop-off, payment was not collected.

## 2022-04-12 NOTE — Telephone Encounter (Signed)
Patient states she will be bringing her FMLA paperwork today and would like to speak with Jenate Swaziland, CMA.

## 2022-04-12 NOTE — Telephone Encounter (Signed)
LVM to call back.

## 2022-04-14 NOTE — Telephone Encounter (Signed)
Spoke to pt and picked up FMLA paperwork and placed in folder for Provider to complete

## 2022-04-14 NOTE — Telephone Encounter (Signed)
Spoke to pt and picked up FMLA paperwork and placed in folder for Provider to complete         

## 2022-04-16 ENCOUNTER — Other Ambulatory Visit: Payer: 59

## 2022-05-04 ENCOUNTER — Telehealth: Payer: Self-pay

## 2022-05-04 NOTE — Telephone Encounter (Signed)
Patient states the parameters for the doctor's visits are missing from the [facility] paperwork.  Patient states if we use the same paperwork, we need to initial and date next to the changes that we make.  Patient states this paperwork must be corrected and sent back in within the next four days.  I transferred call to Jenate Swaziland, CMA, per patient request.

## 2022-05-04 NOTE — Telephone Encounter (Signed)
LVM to call back to office  

## 2022-05-05 NOTE — Telephone Encounter (Signed)
Spoke to pt and she informed me of what was needed for her FMLA forms.

## 2022-05-06 ENCOUNTER — Ambulatory Visit: Payer: 59 | Admitting: Dermatology

## 2022-06-09 ENCOUNTER — Other Ambulatory Visit (INDEPENDENT_AMBULATORY_CARE_PROVIDER_SITE_OTHER): Payer: 59

## 2022-06-09 DIAGNOSIS — I10 Essential (primary) hypertension: Secondary | ICD-10-CM | POA: Diagnosis not present

## 2022-06-09 DIAGNOSIS — Z8639 Personal history of other endocrine, nutritional and metabolic disease: Secondary | ICD-10-CM | POA: Diagnosis not present

## 2022-06-09 DIAGNOSIS — Z87898 Personal history of other specified conditions: Secondary | ICD-10-CM | POA: Diagnosis not present

## 2022-06-10 ENCOUNTER — Other Ambulatory Visit: Payer: Self-pay | Admitting: Family

## 2022-06-10 DIAGNOSIS — E559 Vitamin D deficiency, unspecified: Secondary | ICD-10-CM

## 2022-06-10 LAB — CBC WITH DIFFERENTIAL/PLATELET
Basophils Absolute: 0.1 10*3/uL (ref 0.0–0.1)
Basophils Relative: 0.9 % (ref 0.0–3.0)
Eosinophils Absolute: 0.2 10*3/uL (ref 0.0–0.7)
Eosinophils Relative: 2.2 % (ref 0.0–5.0)
HCT: 42 % (ref 36.0–46.0)
Hemoglobin: 13.7 g/dL (ref 12.0–15.0)
Lymphocytes Relative: 30.6 % (ref 12.0–46.0)
Lymphs Abs: 2.7 10*3/uL (ref 0.7–4.0)
MCHC: 32.7 g/dL (ref 30.0–36.0)
MCV: 88.2 fl (ref 78.0–100.0)
Monocytes Absolute: 0.5 10*3/uL (ref 0.1–1.0)
Monocytes Relative: 5.5 % (ref 3.0–12.0)
Neutro Abs: 5.3 10*3/uL (ref 1.4–7.7)
Neutrophils Relative %: 60.8 % (ref 43.0–77.0)
Platelets: 369 10*3/uL (ref 150.0–400.0)
RBC: 4.76 Mil/uL (ref 3.87–5.11)
RDW: 15.1 % (ref 11.5–15.5)
WBC: 8.8 10*3/uL (ref 4.0–10.5)

## 2022-06-10 LAB — FOLATE: Folate: 5.2 ng/mL — ABNORMAL LOW (ref >5.9)

## 2022-06-10 LAB — COMPREHENSIVE METABOLIC PANEL
ALT: 15 U/L (ref 0–35)
AST: 16 U/L (ref 0–37)
Albumin: 4.3 g/dL (ref 3.5–5.2)
Alkaline Phosphatase: 76 U/L (ref 39–117)
BUN: 8 mg/dL (ref 6–23)
CO2: 29 mEq/L (ref 19–32)
Calcium: 9.2 mg/dL (ref 8.4–10.5)
Chloride: 104 mEq/L (ref 96–112)
Creatinine, Ser: 0.74 mg/dL (ref 0.40–1.20)
GFR: 87.61 mL/min (ref >60.00)
Glucose, Bld: 86 mg/dL (ref 70–99)
Potassium: 4.1 mEq/L (ref 3.5–5.1)
Sodium: 141 mEq/L (ref 135–145)
Total Bilirubin: 0.3 mg/dL (ref 0.2–1.2)
Total Protein: 7 g/dL (ref 6.0–8.3)

## 2022-06-10 LAB — VITAMIN D 25 HYDROXY (VIT D DEFICIENCY, FRACTURES): VITD: 21 ng/mL — ABNORMAL LOW (ref 30.00–100.00)

## 2022-06-10 LAB — VITAMIN B12: Vitamin B-12: 307 pg/mL (ref 211–911)

## 2022-06-10 LAB — HEMOGLOBIN A1C: Hgb A1c MFr Bld: 6.4 % (ref 4.6–6.5)

## 2022-06-10 MED ORDER — CHOLECALCIFEROL 1.25 MG (50000 UT) PO TABS: ORAL_TABLET | ORAL | 0 refills | Status: DC

## 2022-08-17 ENCOUNTER — Encounter: Payer: Self-pay | Admitting: Family

## 2022-08-17 NOTE — Progress Notes (Unsigned)
Abstracted Mammogram from [place] from 08/06/2021.

## 2022-09-15 LAB — HM MAMMOGRAPHY

## 2022-09-20 ENCOUNTER — Other Ambulatory Visit: Payer: Self-pay | Admitting: Family

## 2022-09-20 DIAGNOSIS — F339 Major depressive disorder, recurrent, unspecified: Secondary | ICD-10-CM

## 2022-10-11 ENCOUNTER — Encounter: Payer: Self-pay | Admitting: Family

## 2022-10-11 ENCOUNTER — Ambulatory Visit: Payer: 59 | Admitting: Family

## 2022-10-11 VITALS — BP 138/82 | HR 88 | Temp 98.2°F | Ht 64.0 in | Wt 177.8 lb

## 2022-10-11 DIAGNOSIS — Z1211 Encounter for screening for malignant neoplasm of colon: Secondary | ICD-10-CM

## 2022-10-11 DIAGNOSIS — R7309 Other abnormal glucose: Secondary | ICD-10-CM | POA: Diagnosis not present

## 2022-10-11 DIAGNOSIS — E559 Vitamin D deficiency, unspecified: Secondary | ICD-10-CM | POA: Diagnosis not present

## 2022-10-11 DIAGNOSIS — F339 Major depressive disorder, recurrent, unspecified: Secondary | ICD-10-CM

## 2022-10-11 DIAGNOSIS — I10 Essential (primary) hypertension: Secondary | ICD-10-CM | POA: Diagnosis not present

## 2022-10-11 LAB — POCT GLYCOSYLATED HEMOGLOBIN (HGB A1C): Hemoglobin A1C: 5.7 % — AB (ref 4.0–5.6)

## 2022-10-11 MED ORDER — CHOLECALCIFEROL 1.25 MG (50000 UT) PO TABS
ORAL_TABLET | ORAL | 0 refills | Status: AC
Start: 2022-10-11 — End: ?

## 2022-10-11 MED ORDER — AMLODIPINE BESYLATE 5 MG PO TABS
5.0000 mg | ORAL_TABLET | Freq: Every day | ORAL | 3 refills | Status: DC
Start: 2022-10-11 — End: 2023-09-29

## 2022-10-11 NOTE — Progress Notes (Unsigned)
Assessment & Plan:  Elevated glucose -     POCT glycosylated hemoglobin (Hb A1C)  Screen for colon cancer -     Ambulatory referral to Gastroenterology  Vitamin D deficiency -     Cholecalciferol; 50,000 units PO qwk for 8 weeks.  Dispense: 8 tablet; Refill: 0     Return precautions given.   Risks, benefits, and alternatives of the medications and treatment plan prescribed today were discussed, and patient expressed understanding.   Education regarding symptom management and diagnosis given to patient on AVS either electronically or printed.  No follow-ups on file.  [name], FNP  Subjective:    Patient ID: Kimberly Mclaughlin, female    DOB: [birth year], 62 y.o.   MRN: [id number]  CC: Kimberly Mclaughlin is a 62 y.o. female who presents today for follow up.   HPI: Depression has improved. She may have a day 'every now and agin'.   She is overally pleased with Celexa, Wellbutrin She would like to renew FMLA for intermittent leave approx 2 months/ day.       Allergies: Patient has no known allergies. Current Outpatient Medications on File Prior to Visit  Medication Sig Dispense Refill   amLODipine (NORVASC) 5 MG tablet Take 1 tablet (5 mg total) by mouth daily. 90 tablet 3   buPROPion (WELLBUTRIN XL) 300 MG 24 hr tablet TAKE 1 TABLET(300 MG) BY MOUTH IN THE MORNING 90 tablet 3   citalopram (CELEXA) 10 MG tablet TAKE 1 TABLET(10 MG) BY MOUTH DAILY 90 tablet 1   fluconazole (DIFLUCAN) 150 MG tablet Take 1 tab weekly for 4 weeks 4 tablet 0   lisinopril-hydrochlorothiazide (ZESTORETIC) 20-12.5 MG tablet TAKE 1 TABLET BY MOUTH DAILY 90 tablet 3   rosuvastatin (CRESTOR) 10 MG tablet Take 1 tablet (10 mg total) by mouth daily. 90 tablet 3   ALPRAZolam (XANAX) 0.25 MG tablet Take 1 pill 1 hr before procedure (Patient not taking: Reported on 04/09/2022) 1 tablet 0   No current facility-administered medications on file prior to visit.    Review of Systems  Constitutional:   Negative for chills and fever.  Respiratory:  Negative for cough.   Cardiovascular:  Negative for chest pain and palpitations.  Gastrointestinal:  Negative for nausea and vomiting.      Objective:    BP 138/82   Pulse 88   Temp 98.2 F (36.8 C) (Oral)   Ht 5\' 4"  (1.626 m)   Wt 177 lb 12.8 oz (80.6 kg)   SpO2 98%   BMI 30.52 kg/m  BP Readings from Last 3 Encounters:  10/11/22 138/82  04/09/22 (!) 140/78  01/12/22 128/80   Wt Readings from Last 3 Encounters:  10/11/22 177 lb 12.8 oz (80.6 kg)  04/09/22 175 lb 3.2 oz (79.5 kg)  01/12/22 182 lb (82.6 kg)    Physical Exam Vitals reviewed.  Constitutional:      Appearance: She is well-developed.  Eyes:     Conjunctiva/sclera: Conjunctivae normal.  Cardiovascular:     Rate and Rhythm: Normal rate and regular rhythm.     Pulses: Normal pulses.     Heart sounds: Normal heart sounds.  Pulmonary:     Effort: Pulmonary effort is normal.     Breath sounds: Normal breath sounds. No wheezing, rhonchi or rales.  Skin:    General: Skin is warm and dry.  Neurological:     Mental Status: She is alert.  Psychiatric:        Speech: Speech normal.  Behavior: Behavior normal.        Thought Content: Thought content normal.

## 2022-10-11 NOTE — Patient Instructions (Addendum)
Start amlodipine in addition to  lisinopril- HCTZ 20-12.5mg   Monitor blood pressure at home and me 5-6 reading on separate days. Goal is less than 120/80, based on newest guidelines, however we certainly want to be less than 130/80;  if persistently higher, please make sooner follow up appointment so we can recheck you blood pressure and manage/ adjust medications.  Nice seeing you today!  Managing Your Hypertension Hypertension, also called high blood pressure, is when the force of the blood pressing against the walls of the arteries is too strong. Arteries are blood vessels that carry blood from your heart throughout your body. Hypertension forces the heart to work harder to pump blood and may cause the arteries to become narrow or stiff. Understanding blood pressure readings A blood pressure reading includes a higher number over a lower number: The first, or top, number is called the systolic pressure. It is a measure of the pressure in your arteries as your heart beats. The second, or bottom number, is called the diastolic pressure. It is a measure of the pressure in your arteries as the heart relaxes. For most people, a normal blood pressure is below 120/80. Your personal target blood pressure may vary depending on your medical conditions, your age, and other factors. Blood pressure is classified into four stages. Based on your blood pressure reading, your health care provider may use the following stages to determine what type of treatment you need, if any. Systolic pressure and diastolic pressure are measured in a unit called millimeters of mercury (mmHg). Normal Systolic pressure: below 120. Diastolic pressure: below 80. Elevated Systolic pressure: 120-129. Diastolic pressure: below 80. Hypertension stage 1 Systolic pressure: 130-139. Diastolic pressure: 80-89. Hypertension stage 2 Systolic pressure: 140 or above. Diastolic pressure: 90 or above. How can this condition affect  me? Managing your hypertension is very important. Over time, hypertension can damage the arteries and decrease blood flow to parts of the body, including the brain, heart, and kidneys. Having untreated or uncontrolled hypertension can lead to: A heart attack. A stroke. A weakened blood vessel (aneurysm). Heart failure. Kidney damage. Eye damage. Memory and concentration problems. Vascular dementia. What actions can I take to manage this condition? Hypertension can be managed by making lifestyle changes and possibly by taking medicines. Your health care provider will help you make a plan to bring your blood pressure within a normal range. You may be referred for counseling on a healthy diet and physical activity. Nutrition  Eat a diet that is high in fiber and potassium, and low in salt (sodium), added sugar, and fat. An example eating plan is called the DASH diet. DASH stands for Dietary Approaches to Stop Hypertension. To eat this way: Eat plenty of fresh fruits and vegetables. Try to fill one-half of your plate at each meal with fruits and vegetables. Eat whole grains, such as whole-wheat pasta, brown rice, or whole-grain bread. Fill about one-fourth of your plate with whole grains. Eat low-fat dairy products. Avoid fatty cuts of meat, processed or cured meats, and poultry with skin. Fill about one-fourth of your plate with lean proteins such as fish, chicken without skin, beans, eggs, and tofu. Avoid pre-made and processed foods. These tend to be higher in sodium, added sugar, and fat. Reduce your daily sodium intake. Many people with hypertension should eat less than 1,500 mg of sodium a day. Lifestyle  Work with your health care provider to maintain a healthy body weight or to lose weight. Ask what an ideal weight  is for you. Get at least 30 minutes of exercise that causes your heart to beat faster (aerobic exercise) most days of the week. Activities may include walking, swimming, or  biking. Include exercise to strengthen your muscles (resistance exercise), such as weight lifting, as part of your weekly exercise routine. Try to do these types of exercises for 30 minutes at least 3 days a week. Do not use any products that contain nicotine or tobacco. These products include cigarettes, chewing tobacco, and vaping devices, such as e-cigarettes. If you need help quitting, ask your health care provider. Control any long-term (chronic) conditions you have, such as high cholesterol or diabetes. Identify your sources of stress and find ways to manage stress. This may include meditation, deep breathing, or making time for fun activities. Alcohol use Do not drink alcohol if: Your health care provider tells you not to drink. You are pregnant, may be pregnant, or are planning to become pregnant. If you drink alcohol: Limit how much you have to: 0-1 drink a day for women. 0-2 drinks a day for men. Know how much alcohol is in your drink. In the [place] one drink equals one 12 oz bottle of beer (355 mL), one 5 oz glass of wine (148 mL), or one 1 oz glass of hard liquor (44 mL). Medicines Your health care provider may prescribe medicine if lifestyle changes are not enough to get your blood pressure under control and if: Your systolic blood pressure is 130 or higher. Your diastolic blood pressure is 80 or higher. Take medicines only as told by your health care provider. Follow the directions carefully. Blood pressure medicines must be taken as told by your health care provider. The medicine does not work as well when you skip doses. Skipping doses also puts you at risk for problems. Monitoring Before you monitor your blood pressure: Do not smoke, drink caffeinated beverages, or exercise within 30 minutes before taking a measurement. Use the bathroom and empty your bladder (urinate). Sit quietly for at least 5 minutes before taking measurements. Monitor your blood pressure at home as told  by your health care provider. To do this: Sit with your back straight and supported. Place your feet flat on the floor. Do not cross your legs. Support your arm on a flat surface, such as a table. Make sure your upper arm is at heart level. Each time you measure, take two or three readings one minute apart and record the results. You may also need to have your blood pressure checked regularly by your health care provider. General information Talk with your health care provider about your diet, exercise habits, and other lifestyle factors that may be contributing to hypertension. Review all the medicines you take with your health care provider because there may be side effects or interactions. Keep all follow-up visits. Your health care provider can help you create and adjust your plan for managing your high blood pressure. Where to find more information National Heart, Lung, and Blood Institute: PopSte[employer].is American Heart Association: www.heart.org Contact a health care provider if: You think you are having a reaction to medicines you have taken. You have repeated (recurrent) headaches. You feel dizzy. You have swelling in your ankles. You have trouble with your vision. Get help right away if: You develop a severe headache or confusion. You have unusual weakness or numbness, or you feel faint. You have severe pain in your chest or abdomen. You vomit repeatedly. You have trouble breathing. These symptoms may be an  emergency. Get help right away. Call 911. Do not wait to see if the symptoms will go away. Do not drive yourself to the hospital. Summary Hypertension is when the force of blood pumping through your arteries is too strong. If this condition is not controlled, it may put you at risk for serious complications. Your personal target blood pressure may vary depending on your medical conditions, your age, and other factors. For most people, a normal blood pressure is less than  120/80. Hypertension is managed by lifestyle changes, medicines, or both. Lifestyle changes to help manage hypertension include losing weight, eating a healthy, low-sodium diet, exercising more, stopping smoking, and limiting alcohol. This information is not intended to replace advice given to you by your health care provider. Make sure you discuss any questions you have with your health care provider. Document Revised: 01/29/2021 Document Reviewed: 01/29/2021 Elsevier Patient Education  2023 [employer].

## 2022-10-12 NOTE — Assessment & Plan Note (Signed)
Slightly elevated today.  Patient is not taking amlodipine 5 mg.  I refilled today and reiterated the importance of taking amlodipine.  Continue lisinopril- hctz 20-12.5mg 

## 2022-10-12 NOTE — Assessment & Plan Note (Signed)
Chronic, overall improved today.Continue Wellbutrin 300 mg, Celexa 10 mg . I will fill out FMLA forms once patient leaves at the office for intermittent leave allowed due to depression.

## 2022-10-13 ENCOUNTER — Telehealth: Payer: Self-pay | Admitting: Family

## 2022-10-13 NOTE — Telephone Encounter (Signed)
Patient dropped off document FMLA, to be filled out by provider. Patient requested to send it via Call Patient to pick up within 5-days. Document is located in providers tray at front office.Please advise at Mobile [phone] (mobile)

## 2022-10-14 NOTE — Telephone Encounter (Signed)
Retrieved paperwork and placed in providers folder

## 2022-10-15 NOTE — Telephone Encounter (Signed)
Pt called requesting an update on FMLA paperwork

## 2022-10-15 NOTE — Telephone Encounter (Signed)
LVM to inform pt that Claris Che was out of the office on Thurs and Fri and will return on Mon and I will have her fill out paperwork.

## 2022-10-18 NOTE — Telephone Encounter (Signed)
Fmla completed  Please call pt

## 2022-10-18 NOTE — Telephone Encounter (Signed)
Spoke to pt and informed her FMLA  paperwork is completed and has been faxed and we received the ok that they received it

## 2022-10-20 ENCOUNTER — Encounter: Payer: Self-pay | Admitting: *Deleted

## 2022-10-27 ENCOUNTER — Other Ambulatory Visit: Payer: Self-pay | Admitting: Family

## 2022-10-27 DIAGNOSIS — Z0000 Encounter for general adult medical examination without abnormal findings: Secondary | ICD-10-CM

## 2022-10-27 DIAGNOSIS — F339 Major depressive disorder, recurrent, unspecified: Secondary | ICD-10-CM

## 2022-10-27 NOTE — Telephone Encounter (Signed)
   Prescription Request  10/27/2022  LOV: 10/11/2022  What is the name of the medication or equipment? buPROPion and citalopram  Have you contacted your pharmacy to request a refill? Yes   Which pharmacy would you like this sent to?  [id number] [place], [place] - [address] AT [facility] OF [address] [place] [address] Phone: [phone] Fax: [phone]    Patient notified that their request is being sent to the clinical staff for review and that they should receive a response within 2 business days.   Please advise at Mobile [phone] (mobile)

## 2022-10-28 MED ORDER — BUPROPION HCL ER (XL) 300 MG PO TB24
ORAL_TABLET | ORAL | 1 refills | Status: DC
Start: 2022-10-28 — End: 2023-01-26

## 2022-10-28 NOTE — Telephone Encounter (Signed)
Wellbutrin refilled. Sent to PCP in error

## 2022-10-28 NOTE — Telephone Encounter (Signed)
Citalopram was refilled last month with additional refills

## 2022-12-03 ENCOUNTER — Other Ambulatory Visit (INDEPENDENT_AMBULATORY_CARE_PROVIDER_SITE_OTHER): Payer: 59

## 2022-12-03 ENCOUNTER — Other Ambulatory Visit: Payer: Self-pay | Admitting: Family

## 2022-12-03 DIAGNOSIS — R7309 Other abnormal glucose: Secondary | ICD-10-CM

## 2022-12-03 DIAGNOSIS — E559 Vitamin D deficiency, unspecified: Secondary | ICD-10-CM

## 2022-12-03 NOTE — Addendum Note (Signed)
Addended by: Warden Fillers on: 12/03/2022 10:01 AM   Modules accepted: Orders

## 2022-12-03 NOTE — Telephone Encounter (Signed)
Per last Vit D lab in January; You may start prescription for vitamin  D 50000 units by mouth ONCE weekly for 8 weeks only. I have sent this to your [facility] ORDER pharmacy. After 8 weeks, you MAY stop this dose and resume over the counter cholecalciferol 800 units daily. Please call our office for a follow up visit and we can recheck level in a 3-4 months.   Pt needs a lab appt & should now be on OTC Vit D.  I have called & notified pt & scheduled the lab appt.

## 2022-12-03 NOTE — Addendum Note (Signed)
Addended by: Warden Fillers on: 12/03/2022 03:16 PM   Modules accepted: Orders

## 2022-12-04 LAB — CBC WITH DIFFERENTIAL/PLATELET
Basophils Absolute: 0.1 10*3/uL (ref 0.0–0.2)
Basos: 1 %
EOS (ABSOLUTE): 0.1 10*3/uL (ref 0.0–0.4)
Eos: 1 %
Hematocrit: 43.4 % (ref 34.0–46.6)
Hemoglobin: 14.2 g/dL (ref 11.1–15.9)
Immature Grans (Abs): 0 10*3/uL (ref 0.0–0.1)
Immature Granulocytes: 0 %
Lymphocytes Absolute: 3.2 10*3/uL — ABNORMAL HIGH (ref 0.7–3.1)
Lymphs: 29 %
MCH: 28.6 pg (ref 26.6–33.0)
MCHC: 32.7 g/dL (ref 31.5–35.7)
MCV: 87 fL (ref 79–97)
Monocytes Absolute: 0.8 10*3/uL (ref 0.1–0.9)
Monocytes: 8 %
Neutrophils Absolute: 6.8 10*3/uL (ref 1.4–7.0)
Neutrophils: 61 %
Platelets: 391 10*3/uL (ref 150–450)
RBC: 4.97 x10E6/uL (ref 3.77–5.28)
RDW: 13.9 % (ref 11.7–15.4)
WBC: 11 10*3/uL — ABNORMAL HIGH (ref 3.4–10.8)

## 2022-12-04 LAB — VITAMIN D 25 HYDROXY (VIT D DEFICIENCY, FRACTURES): Vit D, 25-Hydroxy: 61.6 ng/mL (ref 30.0–100.0)

## 2022-12-04 LAB — COMPREHENSIVE METABOLIC PANEL

## 2022-12-04 LAB — LIPID PANEL

## 2022-12-06 LAB — LIPID PANEL
Chol/HDL Ratio: 3.1 ratio (ref 0.0–4.4)
Cholesterol, Total: 182 mg/dL (ref 100–199)

## 2022-12-06 LAB — COMPREHENSIVE METABOLIC PANEL
ALT: 14 IU/L (ref 0–32)
AST: 19 IU/L (ref 0–40)
BUN/Creatinine Ratio: 12 (ref 12–28)
CO2: 20 mmol/L (ref 20–29)
Calcium: 9.7 mg/dL (ref 8.7–10.3)
Creatinine, Ser: 0.77 mg/dL (ref 0.57–1.00)
Globulin, Total: 2.8 g/dL (ref 1.5–4.5)
Potassium: 3.7 mmol/L (ref 3.5–5.2)
Sodium: 141 mmol/L (ref 134–144)
eGFR: 88 mL/min/{1.73_m2} (ref >59)

## 2022-12-06 LAB — SPECIMEN STATUS REPORT

## 2022-12-10 ENCOUNTER — Other Ambulatory Visit: Payer: Self-pay

## 2022-12-10 ENCOUNTER — Telehealth: Payer: Self-pay

## 2022-12-10 NOTE — Telephone Encounter (Signed)
LVM to call back to go over results 

## 2023-01-24 ENCOUNTER — Ambulatory Visit: Payer: 59 | Admitting: Family

## 2023-01-26 ENCOUNTER — Encounter: Payer: Self-pay | Admitting: Family Medicine

## 2023-01-26 ENCOUNTER — Ambulatory Visit: Payer: 59 | Admitting: Family Medicine

## 2023-01-26 DIAGNOSIS — F339 Major depressive disorder, recurrent, unspecified: Secondary | ICD-10-CM | POA: Diagnosis not present

## 2023-01-26 DIAGNOSIS — I10 Essential (primary) hypertension: Secondary | ICD-10-CM

## 2023-01-26 DIAGNOSIS — Z0000 Encounter for general adult medical examination without abnormal findings: Secondary | ICD-10-CM

## 2023-01-26 MED ORDER — BUPROPION HCL ER (XL) 150 MG PO TB24: ORAL_TABLET | ORAL | 0 refills | Status: DC

## 2023-01-26 MED ORDER — LISINOPRIL-HYDROCHLOROTHIAZIDE 20-12.5 MG PO TABS: 2.0000 | ORAL_TABLET | Freq: Every day | ORAL | 0 refills | Status: DC

## 2023-01-26 NOTE — Patient Instructions (Addendum)
It was a pleasure meeting you today. Thank you for allowing me to take part in your health care.  Our goals for today as we discussed include:  Take your regular Zestoretic tonight Increase Zestoretic to 2 tablets in the morning. Start this tomorrow 08/29 Take Amlodipine 5 mg at night  Start Wellbutrin XL to 150 mg in the morning Stop Wellbutrin XL 300 mg Continue Celexa 10 mg in the morning  Monitor blood pressure.  Goal <150/90  Recommend Shingles vaccine.  This is a 2 dose series and can be given at your local pharmacy.  Please talk to your pharmacist about this.   Recommend Tetanus Vaccination.  This is given every 10 years.   Recommend Pneumonia 20 vaccine.  One time vaccine   This is a list of the screening recommended for you and due dates:  Health Maintenance  Topic Date Due   DTaP/Tdap/Td vaccine (1 - Tdap) Never done   Pap Smear  Never done   Colon Cancer Screening  Never done   Zoster (Shingles) Vaccine (1 of 2) Never done   COVID-19 Vaccine (3 - 2023-24 season) 01/29/2022   Flu Shot  12/30/2022   Mammogram  09/14/2024   HPV Vaccine  Aged Out   Hepatitis C Screening  Discontinued   HIV Screening  Discontinued     Follow up in 2 weeks  If you have any questions or concerns, please do not hesitate to call the office at [phone].  I look forward to our next visit and until then take care and stay safe.  Regards,   Dana Allan, MD   [facility]

## 2023-01-26 NOTE — Progress Notes (Unsigned)
SUBJECTIVE:   Chief Complaint  Patient presents with   Hypertension   HPI Patient presents for acute visit.   Endorses elevated blood pressure. Has been running high at home and is concerned for being uncontrolled. Currently taking Norvasc 5 mg am and Zestoretic 20-12.5 mg daily.  Has not been able to keep BP low. Denies any headaches, visual changes,shortness of breath, chest pain  PERTINENT PMH / PSH: HTN HLD Mood disorder  OBJECTIVE:  BP (!) 140/98   Pulse 88   Temp 98 F (36.7 C)   Resp 16   Ht 5\' 4"  (1.626 m)   Wt 170 lb 6 oz (77.3 kg)   SpO2 99%   BMI 29.24 kg/m    Physical Exam Vitals reviewed.  Constitutional:      General: She is not in acute distress.    Appearance: Normal appearance. She is normal weight. She is not ill-appearing, toxic-appearing or diaphoretic.  Eyes:     General:        Right eye: No discharge.        Left eye: No discharge.     Conjunctiva/sclera: Conjunctivae normal.  Cardiovascular:     Rate and Rhythm: Normal rate and regular rhythm.     Heart sounds: Normal heart sounds.  Pulmonary:     Effort: Pulmonary effort is normal.     Breath sounds: Normal breath sounds.  Abdominal:     General: Bowel sounds are normal.  Musculoskeletal:        General: Normal range of motion.  Skin:    General: Skin is warm and dry.  Neurological:     General: No focal deficit present.     Mental Status: She is alert and oriented to person, place, and time. Mental status is at baseline.  Psychiatric:        Mood and Affect: Mood normal.        Behavior: Behavior normal.        Thought Content: Thought content normal.        Judgment: Judgment normal.        02/10/2023   11:12 AM 01/26/2023    3:26 PM 10/11/2022    3:05 PM 04/09/2022    2:05 PM 10/23/2021    3:26 PM  Depression screen PHQ 2/9  Decreased Interest 0 0 0 0 1  Down, Depressed, Hopeless 0 0 0 0 1  PHQ - 2 Score 0 0 0 0 2  Altered sleeping 0 1 1 0 0  Tired, decreased  energy 0 0 1 0 0  Change in appetite 0 0 0 0 0  Feeling bad or failure about yourself  0 0 0 0 0  Trouble concentrating 0 0 0 0 1  Moving slowly or fidgety/restless 0 0 0 0 0  Suicidal thoughts 0 0 0 0 0  PHQ-9 Score 0 1 2 0 3  Difficult doing work/chores Not difficult at all Not difficult at all Not difficult at all Not difficult at all Somewhat difficult      02/10/2023   11:13 AM 01/26/2023    3:26 PM 10/11/2022    3:05 PM 04/09/2022    2:06 PM  GAD 7 : Generalized Anxiety Score  Nervous, Anxious, on Edge 0 0 0 0  Control/stop worrying 0 0 0 0  Worry too much - different things 0 0 0 0  Trouble relaxing 0 0 0 0  Restless 0 0 0 0  Easily annoyed or irritable 0 0  0 0  Afraid - awful might happen 0 0 0 0  Total GAD 7 Score 0 0 0 0  Anxiety Difficulty Not difficult at all Not difficult at all Not difficult at all Not difficult at all    ASSESSMENT/PLAN:  Depression, recurrent [facility]) Assessment & Plan: Decrease Wellbutrin to 150 mg am.  May be contributing to increased BP Continue Celexa 10 mg daily  Follow up with PCP in 2 weeks   Essential hypertension Assessment & Plan: Elevated BP Increase Zestoretic to 40-25 mg daily Continue Amlodipine 5 mg.  Recommend switching to night dosing Check Cmet Strict parameters provided. Follow up in 2 weeks with PCP.  Orders: -     Comprehensive metabolic panel   PDMP reviewed  Return in about 2 weeks (around 02/09/2023).  Dana Allan, MD

## 2023-01-27 ENCOUNTER — Telehealth: Payer: Self-pay | Admitting: Family

## 2023-01-27 LAB — COMPREHENSIVE METABOLIC PANEL
ALT: 12 IU/L (ref 0–32)
AST: 18 IU/L (ref 0–40)
Albumin: 4.3 g/dL (ref 3.9–4.9)
Alkaline Phosphatase: 99 IU/L (ref 44–121)
BUN/Creatinine Ratio: 15 (ref 12–28)
BUN: 11 mg/dL (ref 8–27)
Bilirubin Total: <0.2 mg/dL (ref 0.0–1.2)
CO2: 23 mmol/L (ref 20–29)
Calcium: 9.8 mg/dL (ref 8.7–10.3)
Chloride: 104 mmol/L (ref 96–106)
Creatinine, Ser: 0.72 mg/dL (ref 0.57–1.00)
Globulin, Total: 3 g/dL (ref 1.5–4.5)
Glucose: 83 mg/dL (ref 70–99)
Potassium: 3.8 mmol/L (ref 3.5–5.2)
Sodium: 144 mmol/L (ref 134–144)
Total Protein: 7.3 g/dL (ref 6.0–8.5)
eGFR: 95 mL/min/{1.73_m2} (ref >59)

## 2023-01-27 NOTE — Telephone Encounter (Signed)
Gave her the results of her labs.

## 2023-01-27 NOTE — Telephone Encounter (Signed)
 Pt returned [facility] CMA call. Transferred.

## 2023-02-10 ENCOUNTER — Ambulatory Visit: Payer: 59 | Admitting: Family

## 2023-02-10 ENCOUNTER — Encounter: Payer: Self-pay | Admitting: Family

## 2023-02-10 VITALS — BP 150/96 | HR 80 | Temp 98.0°F | Ht 64.0 in | Wt 169.2 lb

## 2023-02-10 DIAGNOSIS — I10 Essential (primary) hypertension: Secondary | ICD-10-CM

## 2023-02-10 DIAGNOSIS — F339 Major depressive disorder, recurrent, unspecified: Secondary | ICD-10-CM | POA: Diagnosis not present

## 2023-02-10 MED ORDER — LISINOPRIL-HYDROCHLOROTHIAZIDE 20-12.5 MG PO TABS
2.0000 | ORAL_TABLET | Freq: Every morning | ORAL | 3 refills | Status: DC
Start: 2023-02-10 — End: 2023-09-29

## 2023-02-10 MED ORDER — CITALOPRAM HYDROBROMIDE 10 MG PO TABS
10.0000 mg | ORAL_TABLET | Freq: Every day | ORAL | 3 refills | Status: DC
Start: 2023-02-10 — End: 2023-09-29

## 2023-02-10 MED ORDER — BUPROPION HCL ER (XL) 150 MG PO TB24
150.0000 mg | ORAL_TABLET | Freq: Every day | ORAL | 3 refills | Status: DC
Start: 2023-02-10 — End: 2023-09-29

## 2023-02-10 NOTE — Progress Notes (Signed)
Assessment & Plan:  Essential hypertension Assessment & Plan: Blood pressure has improved from prior. Goal < 130/80.   We discussed increasing amlodipine from 5 to 10 mg.  Patient would like to modify caffeine intake which I think is reasonable and give regimen another week.  If blood pressure remains elevated, patient will let me know.  Discussed low-sodium diet.  Continue lisinopril hydrochlorothiazide 20-12.5 mg daily 2 tablets every morning, amlodipine 5 mg every afternoon.  Orders: -     Lisinopril-hydroCHLOROthiazide; Take 2 tablets by mouth every morning.  Dispense: 180 tablet; Refill: 3 -     Comprehensive metabolic panel  Depression, recurrent (HCC) Assessment & Plan: Chronic, stable.  Continue reduced dose of Wellbutrin 150 mg daily, citalopram 10 mg daily.  Orders: -     buPROPion HCl ER (XL); Take 1 tablet (150 mg total) by mouth daily.  Dispense: 90 tablet; Refill: 3 -     Citalopram Hydrobromide; Take 1 tablet (10 mg total) by mouth daily.  Dispense: 90 tablet; Refill: 3     Return precautions given.   Risks, benefits, and alternatives of the medications and treatment plan prescribed today were discussed, and patient expressed understanding.   Education regarding symptom management and diagnosis given to patient on AVS either electronically or printed.  Return in about 2 weeks (around 02/24/2023).  [name], FNP  Subjective:    Patient ID: Kimberly Mclaughlin, female    DOB: [birth year], 62 y.o.   MRN: [id number]  CC: Kimberly Mclaughlin is a 62 y.o. female who presents today for follow up.   HPI: Follow-up elevated blood pressure  She is now taking two tablets of lisinopril- hydrochlorothiazide 20-12.5mg  in the morning now ( previously taken in the morning) . Compliant amlodipine 5mg  at bedtime.   Denies CP, HA, SOB  She follows low sodium diet    BP had been 170/ 101 ( at [facility])  BP  150/ 101 ( at dentist) ahead of visit with Dr Clent Ridges. She was seen  01/26/23 for depression, HTN   Decreased wellbutrin to 150mg  to see if aggrevating anxiety.  Lisinopril/hydrochlorothiazide was increased to 2 tablets daily.   She is now taking two tablets of lisinopril- hydrochlorothiazide 20-12.5mg  in the morning now ( previously taken in the morning) . Compliant amlodipine 5mg  at bedtime.   She drinks 3-4 espresso shots per day  Walking 4 days per week and she had lost weight   Allergies: Patient has no known allergies. Current Outpatient Medications on File Prior to Visit  Medication Sig Dispense Refill   amLODipine (NORVASC) 5 MG tablet Take 1 tablet (5 mg total) by mouth daily. 90 tablet 3   Cholecalciferol 1.25 MG (50000 UT) TABS 50,000 units PO qwk for 8 weeks. 8 tablet 0   fluconazole (DIFLUCAN) 150 MG tablet Take 1 tab weekly for 4 weeks 4 tablet 0   No current facility-administered medications on file prior to visit.    Review of Systems  Constitutional:  Negative for chills and fever.  Respiratory:  Negative for cough.   Cardiovascular:  Negative for chest pain and palpitations.  Gastrointestinal:  Negative for nausea and vomiting.      Objective:    BP (!) 150/96   Pulse 80   Temp 98 F (36.7 C) (Oral)   Ht 5\' 4"  (1.626 m)   Wt 169 lb 3.2 oz (76.7 kg)   SpO2 98%   BMI 29.04 kg/m  BP Readings from Last 3 Encounters:  02/10/23 (!) 150/96  01/26/23 (!) 140/98  10/11/22 138/82   Wt Readings from Last 3 Encounters:  02/10/23 169 lb 3.2 oz (76.7 kg)  01/26/23 170 lb 6 oz (77.3 kg)  10/11/22 177 lb 12.8 oz (80.6 kg)    Physical Exam Vitals reviewed.  Constitutional:      Appearance: She is well-developed.  Eyes:     Conjunctiva/sclera: Conjunctivae normal.  Cardiovascular:     Rate and Rhythm: Normal rate and regular rhythm.     Pulses: Normal pulses.     Heart sounds: Normal heart sounds.  Pulmonary:     Effort: Pulmonary effort is normal.     Breath sounds: Normal breath sounds. No wheezing, rhonchi or rales.   Skin:    General: Skin is warm and dry.  Neurological:     Mental Status: She is alert.  Psychiatric:        Speech: Speech normal.        Behavior: Behavior normal.        Thought Content: Thought content normal.

## 2023-02-10 NOTE — Patient Instructions (Addendum)
Monitor blood pressure , goal is < 120/80  We will likely increase amlodipine to 10mg  if blood pressure doesn't meet goal in the next week.   Decrease caffeine as we discussed   Managing Your Hypertension Hypertension, also called high blood pressure, is when the force of the blood pressing against the walls of the arteries is too strong. Arteries are blood vessels that carry blood from your heart throughout your body. Hypertension forces the heart to work harder to pump blood and may cause the arteries to become narrow or stiff. Understanding blood pressure readings A blood pressure reading includes a higher number over a lower number: The first, or top, number is called the systolic pressure. It is a measure of the pressure in your arteries as your heart beats. The second, or bottom number, is called the diastolic pressure. It is a measure of the pressure in your arteries as the heart relaxes. For most people, a normal blood pressure is below 120/80. Your personal target blood pressure may vary depending on your medical conditions, your age, and other factors. Blood pressure is classified into four stages. Based on your blood pressure reading, your health care provider may use the following stages to determine what type of treatment you need, if any. Systolic pressure and diastolic pressure are measured in a unit called millimeters of mercury (mmHg). Normal Systolic pressure: below 120. Diastolic pressure: below 80. Elevated Systolic pressure: 120-129. Diastolic pressure: below 80. Hypertension stage 1 Systolic pressure: 130-139. Diastolic pressure: 80-89. Hypertension stage 2 Systolic pressure: 140 or above. Diastolic pressure: 90 or above. How can this condition affect me? Managing your hypertension is very important. Over time, hypertension can damage the arteries and decrease blood flow to parts of the body, including the brain, heart, and kidneys. Having untreated or uncontrolled  hypertension can lead to: A heart attack. A stroke. A weakened blood vessel (aneurysm). Heart failure. Kidney damage. Eye damage. Memory and concentration problems. Vascular dementia. What actions can I take to manage this condition? Hypertension can be managed by making lifestyle changes and possibly by taking medicines. Your health care provider will help you make a plan to bring your blood pressure within a normal range. You may be referred for counseling on a healthy diet and physical activity. Nutrition  Eat a diet that is high in fiber and potassium, and low in salt (sodium), added sugar, and fat. An example eating plan is called the DASH diet. DASH stands for Dietary Approaches to Stop Hypertension. To eat this way: Eat plenty of fresh fruits and vegetables. Try to fill one-half of your plate at each meal with fruits and vegetables. Eat whole grains, such as whole-wheat pasta, brown rice, or whole-grain bread. Fill about one-fourth of your plate with whole grains. Eat low-fat dairy products. Avoid fatty cuts of meat, processed or cured meats, and poultry with skin. Fill about one-fourth of your plate with lean proteins such as fish, chicken without skin, beans, eggs, and tofu. Avoid pre-made and processed foods. These tend to be higher in sodium, added sugar, and fat. Reduce your daily sodium intake. Many people with hypertension should eat less than 1,500 mg of sodium a day. Lifestyle  Work with your health care provider to maintain a healthy body weight or to lose weight. Ask what an ideal weight is for you. Get at least 30 minutes of exercise that causes your heart to beat faster (aerobic exercise) most days of the week. Activities may include walking, swimming, or biking. Include  exercise to strengthen your muscles (resistance exercise), such as weight lifting, as part of your weekly exercise routine. Try to do these types of exercises for 30 minutes at least 3 days a week. Do not  use any products that contain nicotine or tobacco. These products include cigarettes, chewing tobacco, and vaping devices, such as e-cigarettes. If you need help quitting, ask your health care provider. Control any long-term (chronic) conditions you have, such as high cholesterol or diabetes. Identify your sources of stress and find ways to manage stress. This may include meditation, deep breathing, or making time for fun activities. Alcohol use Do not drink alcohol if: Your health care provider tells you not to drink. You are pregnant, may be pregnant, or are planning to become pregnant. If you drink alcohol: Limit how much you have to: 0-1 drink a day for women. 0-2 drinks a day for men. Know how much alcohol is in your drink. In the [place] one drink equals one 12 oz bottle of beer (355 mL), one 5 oz glass of wine (148 mL), or one 1 oz glass of hard liquor (44 mL). Medicines Your health care provider may prescribe medicine if lifestyle changes are not enough to get your blood pressure under control and if: Your systolic blood pressure is 130 or higher. Your diastolic blood pressure is 80 or higher. Take medicines only as told by your health care provider. Follow the directions carefully. Blood pressure medicines must be taken as told by your health care provider. The medicine does not work as well when you skip doses. Skipping doses also puts you at risk for problems. Monitoring Before you monitor your blood pressure: Do not smoke, drink caffeinated beverages, or exercise within 30 minutes before taking a measurement. Use the bathroom and empty your bladder (urinate). Sit quietly for at least 5 minutes before taking measurements. Monitor your blood pressure at home as told by your health care provider. To do this: Sit with your back straight and supported. Place your feet flat on the floor. Do not cross your legs. Support your arm on a flat surface, such as a table. Make sure your upper  arm is at heart level. Each time you measure, take two or three readings one minute apart and record the results. You may also need to have your blood pressure checked regularly by your health care provider. General information Talk with your health care provider about your diet, exercise habits, and other lifestyle factors that may be contributing to hypertension. Review all the medicines you take with your health care provider because there may be side effects or interactions. Keep all follow-up visits. Your health care provider can help you create and adjust your plan for managing your high blood pressure. Where to find more information National Heart, Lung, and Blood Institute: [employer].is American Heart Association: www.heart.org Contact a health care provider if: You think you are having a reaction to medicines you have taken. You have repeated (recurrent) headaches. You feel dizzy. You have swelling in your ankles. You have trouble with your vision. Get help right away if: You develop a severe headache or confusion. You have unusual weakness or numbness, or you feel faint. You have severe pain in your chest or abdomen. You vomit repeatedly. You have trouble breathing. These symptoms may be an emergency. Get help right away. Call 911. Do not wait to see if the symptoms will go away. Do not drive yourself to the hospital. Summary Hypertension is when the force of  blood pumping through your arteries is too strong. If this condition is not controlled, it may put you at risk for serious complications. Your personal target blood pressure may vary depending on your medical conditions, your age, and other factors. For most people, a normal blood pressure is less than 120/80. Hypertension is managed by lifestyle changes, medicines, or both. Lifestyle changes to help manage hypertension include losing weight, eating a healthy, low-sodium diet, exercising more, stopping smoking, and  limiting alcohol. This information is not intended to replace advice given to you by your health care provider. Make sure you discuss any questions you have with your health care provider. Document Revised: 01/29/2021 Document Reviewed: 01/29/2021 Elsevier Patient Education  2024 [employer].

## 2023-02-11 LAB — COMPREHENSIVE METABOLIC PANEL
ALT: 17 IU/L (ref 0–32)
AST: 21 IU/L (ref 0–40)
Albumin: 4.6 g/dL (ref 3.9–4.9)
Alkaline Phosphatase: 93 IU/L (ref 44–121)
BUN/Creatinine Ratio: 17 (ref 12–28)
BUN: 13 mg/dL (ref 8–27)
Bilirubin Total: 0.3 mg/dL (ref 0.0–1.2)
CO2: 23 mmol/L (ref 20–29)
Calcium: 10.3 mg/dL (ref 8.7–10.3)
Chloride: 101 mmol/L (ref 96–106)
Creatinine, Ser: 0.76 mg/dL (ref 0.57–1.00)
Globulin, Total: 3.3 g/dL (ref 1.5–4.5)
Glucose: 96 mg/dL (ref 70–99)
Potassium: 3.9 mmol/L (ref 3.5–5.2)
Sodium: 143 mmol/L (ref 134–144)
Total Protein: 7.9 g/dL (ref 6.0–8.5)
eGFR: 89 mL/min/{1.73_m2} (ref >59)

## 2023-02-14 NOTE — Assessment & Plan Note (Addendum)
Blood pressure has improved from prior. Goal < 130/80.   We discussed increasing amlodipine from 5 to 10 mg.  Patient would like to modify caffeine intake which I think is reasonable and give regimen another week.  If blood pressure remains elevated, patient will let me know.  Discussed low-sodium diet.  Continue lisinopril hydrochlorothiazide 20-12.5 mg daily 2 tablets every morning, amlodipine 5 mg every afternoon.

## 2023-02-14 NOTE — Assessment & Plan Note (Signed)
Chronic, stable.  Continue reduced dose of Wellbutrin 150 mg daily, citalopram 10 mg daily.

## 2023-02-16 ENCOUNTER — Encounter: Payer: Self-pay | Admitting: Family Medicine

## 2023-02-16 ENCOUNTER — Telehealth: Payer: Self-pay

## 2023-02-16 NOTE — Telephone Encounter (Signed)
LVM for pt to give office a call back in regards to lab results

## 2023-02-16 NOTE — Telephone Encounter (Signed)
Pt called back and I read the note and she verbalized understanding

## 2023-02-16 NOTE — Telephone Encounter (Signed)
-----   Message from [name] sent at 02/14/2023  6:28 AM EDT ----- Call pt Renal function and electrolytes are normal

## 2023-02-16 NOTE — Assessment & Plan Note (Signed)
Decrease Wellbutrin to 150 mg am.  May be contributing to increased BP Continue Celexa 10 mg daily  Follow up with PCP in 2 weeks

## 2023-02-16 NOTE — Assessment & Plan Note (Signed)
Elevated BP Increase Zestoretic to 40-25 mg daily Continue Amlodipine 5 mg.  Recommend switching to night dosing Check Cmet Strict parameters provided. Follow up in 2 weeks with PCP.

## 2023-02-24 ENCOUNTER — Ambulatory Visit: Payer: 59 | Admitting: Family

## 2023-03-08 ENCOUNTER — Encounter: Payer: Self-pay | Admitting: Family

## 2023-05-27 ENCOUNTER — Other Ambulatory Visit: Payer: Self-pay | Admitting: Family

## 2023-05-27 DIAGNOSIS — Z0000 Encounter for general adult medical examination without abnormal findings: Secondary | ICD-10-CM

## 2023-05-27 DIAGNOSIS — F339 Major depressive disorder, recurrent, unspecified: Secondary | ICD-10-CM

## 2023-05-27 NOTE — Telephone Encounter (Signed)
Medication was discontinued on 01/26/2023. Is it okay to refuse medication?

## 2023-07-11 ENCOUNTER — Ambulatory Visit: Payer: 59 | Admitting: Family

## 2023-07-26 ENCOUNTER — Ambulatory Visit (INDEPENDENT_AMBULATORY_CARE_PROVIDER_SITE_OTHER): Payer: 59 | Admitting: Family

## 2023-07-26 ENCOUNTER — Encounter: Payer: Self-pay | Admitting: Family

## 2023-07-26 VITALS — BP 138/86 | HR 65 | Temp 97.8°F | Ht 64.0 in | Wt 158.6 lb

## 2023-07-26 DIAGNOSIS — Z0000 Encounter for general adult medical examination without abnormal findings: Secondary | ICD-10-CM

## 2023-07-26 DIAGNOSIS — Z1211 Encounter for screening for malignant neoplasm of colon: Secondary | ICD-10-CM

## 2023-07-26 DIAGNOSIS — F339 Major depressive disorder, recurrent, unspecified: Secondary | ICD-10-CM

## 2023-07-26 MED ORDER — HYDROXYZINE HCL 10 MG PO TABS
10.0000 mg | ORAL_TABLET | Freq: Two times a day (BID) | ORAL | 0 refills | Status: DC | PRN
Start: 2023-07-26 — End: 2024-04-18

## 2023-07-26 NOTE — Progress Notes (Unsigned)
 Assessment & Plan:  There are no diagnoses linked to this encounter.   Return precautions given.   Risks, benefits, and alternatives of the medications and treatment plan prescribed today were discussed, and patient expressed understanding.   Education regarding symptom management and diagnosis given to patient on AVS either electronically or printed.  No follow-ups on file.  [name], FNP  Subjective:    Patient ID: Kimberly Mclaughlin, female    DOB: 09-19-1960, 63 y.o.   MRN: [id number]  CC: Kimberly Mclaughlin is a 63 y.o. female who presents today for physical exam.    HPI: Overall feels well today She is dating and interested in counseling to address      Colorectal Cancer Screening: due age [place] [facility] is commendations Breast Cancer Screening: Mammogram UTD 09/12/22  Cervical Cancer Screening: due; she would anxiety medication.  Bone Health screening/DEXA for 65+: No increased fracture risk. Defer screening at this time.        Tetanus - UTD Exercise: Gets regular exercise, walking 3 days per week. Eating healthier.  Alcohol use:  none Smoking/tobacco use: Nonsmoker.    Health Maintenance  Topic Date Due   DTaP/Tdap/Td vaccine (1 - Tdap) Never done   Pap with HPV screening  Never done   Colon Cancer Screening  Never done   Zoster (Shingles) Vaccine (1 of 2) Never done   COVID-19 Vaccine (3 - 2024-25 season) 01/30/2023   Flu Shot  08/29/2023*   Mammogram  09/14/2024   HPV Vaccine  Aged Out   Hepatitis C Screening  Discontinued   HIV Screening  Discontinued  *Topic was postponed. The date shown is not the original due date.    ALLERGIES: Patient has no known allergies.  Current Outpatient Medications on File Prior to Visit  Medication Sig Dispense Refill   amLODipine (NORVASC) 5 MG tablet Take 1 tablet (5 mg total) by mouth daily. 90 tablet 3   buPROPion (WELLBUTRIN XL) 150 MG 24 hr tablet Take 1 tablet (150 mg total) by mouth daily. 90 tablet 3    Cholecalciferol 1.25 MG (50000 UT) TABS 50,000 units PO qwk for 8 weeks. 8 tablet 0   citalopram (CELEXA) 10 MG tablet Take 1 tablet (10 mg total) by mouth daily. 90 tablet 3   fluconazole (DIFLUCAN) 150 MG tablet Take 1 tab weekly for 4 weeks 4 tablet 0   lisinopril-hydrochlorothiazide (ZESTORETIC) 20-12.5 MG tablet Take 2 tablets by mouth every morning. 180 tablet 3   No current facility-administered medications on file prior to visit.    Review of Systems  Constitutional:  Negative for chills and fever.  Respiratory:  Negative for cough.   Cardiovascular:  Negative for chest pain and palpitations.  Gastrointestinal:  Negative for nausea and vomiting.      Objective:    BP 138/86   Pulse 65   Temp 97.8 F (36.6 C) (Oral)   Ht 5\' 4"  (1.626 m)   Wt 158 lb 9.6 oz (71.9 kg)   SpO2 99%   BMI 27.22 kg/m   BP Readings from Last 3 Encounters:  07/26/23 138/86  02/10/23 (!) 150/96  01/26/23 (!) 140/98   Wt Readings from Last 3 Encounters:  07/26/23 158 lb 9.6 oz (71.9 kg)  02/10/23 169 lb 3.2 oz (76.7 kg)  01/26/23 170 lb 6 oz (77.3 kg)      07/26/2023   12:14 PM 02/10/2023   11:12 AM 01/26/2023    3:26 PM  Depression screen PHQ 2/9  Decreased Interest 0 0 0  Down, Depressed, Hopeless 0 0 0  PHQ - 2 Score 0 0 0  Altered sleeping 0 0 1  Tired, decreased energy 0 0 0  Change in appetite 0 0 0  Feeling bad or failure about yourself  0 0 0  Trouble concentrating 0 0 0  Moving slowly or fidgety/restless 0 0 0  Suicidal thoughts 0 0 0  PHQ-9 Score 0 0 1  Difficult doing work/chores Not difficult at all Not difficult at all Not difficult at all     Physical Exam Vitals reviewed.  Constitutional:      Appearance: She is well-developed.  Eyes:     Conjunctiva/sclera: Conjunctivae normal.  Neck:     Thyroid: No thyroid mass or thyromegaly.  Cardiovascular:     Rate and Rhythm: Normal rate and regular rhythm.     Pulses: Normal pulses.     Heart sounds: Normal heart  sounds.  Pulmonary:     Effort: Pulmonary effort is normal.     Breath sounds: Normal breath sounds. No wheezing, rhonchi or rales.  Lymphadenopathy:     Head:     Right side of head: No submental, submandibular, tonsillar, preauricular, posterior auricular or occipital adenopathy.     Left side of head: No submental, submandibular, tonsillar, preauricular, posterior auricular or occipital adenopathy.     Cervical: No cervical adenopathy.  Skin:    General: Skin is warm and dry.  Neurological:     Mental Status: She is alert.  Psychiatric:        Speech: Speech normal.        Behavior: Behavior normal.        Thought Content: Thought content normal.

## 2023-07-26 NOTE — Assessment & Plan Note (Signed)
 Due to patient preference, deferred CBE and pelvic exam. She would like make an appointment with GYN; provided atarax to use prior to pelvix exam.  Referral for colonoscopy

## 2023-07-26 NOTE — Patient Instructions (Addendum)
 I provided you with atarax to take prior to pelvic exam.   Please reach out to counselor through your employer and let me know if you would need a referral for private counselor.    Referral to colonoscopy.    Let us know if you dont hear back within a week in regards to an appointment being scheduled.   So that you are aware, if you are [facility] MyChart user , please pay attention to your MyChart messages as you may receive a MyChart message with a phone number to call and schedule this test/appointment own your own from our referral coordinator. This is a new process so I do not want you to miss this message.  If you are not a MyChart user, you will receive a phone call.    Health Maintenance for Postmenopausal Women Menopause is a normal process in which your ability to get pregnant comes to an end. This process happens slowly over many months or years, usually between the ages of 15 and 20. Menopause is complete when you have missed your menstrual period for 12 months. It is important to talk with your health care provider about some of the most common conditions that affect women after menopause (postmenopausal women). These include heart disease, cancer, and bone loss (osteoporosis). Adopting a healthy lifestyle and getting preventive care can help to promote your health and wellness. The actions you take can also lower your chances of developing some of these common conditions. What are the signs and symptoms of menopause? During menopause, you may have the following symptoms: Hot flashes. These can be moderate or severe. Night sweats. Decrease in sex drive. Mood swings. Headaches. Tiredness (fatigue). Irritability. Memory problems. Problems falling asleep or staying asleep. Talk with your health care provider about treatment options for your symptoms. Do I need hormone replacement therapy? Hormone replacement therapy is effective in treating symptoms that are caused by menopause,  such as hot flashes and night sweats. Hormone replacement carries certain risks, especially as you become older. If you are thinking about using estrogen or estrogen with progestin, discuss the benefits and risks with your health care provider. How can I reduce my risk for heart disease and stroke? The risk of heart disease, heart attack, and stroke increases as you age. One of the causes may be a change in the body's hormones during menopause. This can affect how your body uses dietary fats, triglycerides, and cholesterol. Heart attack and stroke are medical emergencies. There are many things that you can do to help prevent heart disease and stroke. Watch your blood pressure High blood pressure causes heart disease and increases the risk of stroke. This is more likely to develop in people who have high blood pressure readings or are overweight. Have your blood pressure checked: Every 3-5 years if you are 59-1 years of age. Every year if you are 37 years old or older. Eat a healthy diet  Eat a diet that includes plenty of vegetables, fruits, low-fat dairy products, and lean protein. Do not eat a lot of foods that are high in solid fats, added sugars, or sodium. Get regular exercise Get regular exercise. This is one of the most important things you can do for your health. Most adults should: Try to exercise for at least 150 minutes each week. The exercise should increase your heart rate and make you sweat (moderate-intensity exercise). Try to do strengthening exercises at least twice each week. Do these in addition to the moderate-intensity exercise. Spend  less time sitting. Even light physical activity can be beneficial. Other tips Work with your health care provider to achieve or maintain a healthy weight. Do not use any products that contain nicotine or tobacco. These products include cigarettes, chewing tobacco, and vaping devices, such as e-cigarettes. If you need help quitting, ask your  health care provider. Know your numbers. Ask your health care provider to check your cholesterol and your blood sugar (glucose). Continue to have your blood tested as directed by your health care provider. Do I need screening for cancer? Depending on your health history and family history, you may need to have cancer screenings at different stages of your life. This may include screening for: Breast cancer. Cervical cancer. Lung cancer. Colorectal cancer. What is my risk for osteoporosis? After menopause, you may be at increased risk for osteoporosis. Osteoporosis is a condition in which bone destruction happens more quickly than new bone creation. To help prevent osteoporosis or the bone fractures that can happen because of osteoporosis, you may take the following actions: If you are 27-72 years old, get at least 1,000 mg of calcium and at least 600 international units (IU) of vitamin D per day. If you are older than age 26 but younger than age 93, get at least 1,200 mg of calcium and at least 600 international units (IU) of vitamin D per day. If you are older than age 79, get at least 1,200 mg of calcium and at least 800 international units (IU) of vitamin D per day. Smoking and drinking excessive alcohol increase the risk of osteoporosis. Eat foods that are rich in calcium and vitamin D, and do weight-bearing exercises several times each week as directed by your health care provider. How does menopause affect my mental health? Depression may occur at any age, but it is more common as you become older. Common symptoms of depression include: Feeling depressed. Changes in sleep patterns. Changes in appetite or eating patterns. Feeling an overall lack of motivation or enjoyment of activities that you previously enjoyed. Frequent crying spells. Talk with your health care provider if you think that you are experiencing any of these symptoms. General instructions See your health care provider for  regular wellness exams and vaccines. This may include: Scheduling regular health, dental, and eye exams. Getting and maintaining your vaccines. These include: Influenza vaccine. Get this vaccine each year before the flu season begins. Pneumonia vaccine. Shingles vaccine. Tetanus, diphtheria, and pertussis (Tdap) booster vaccine. Your health care provider may also recommend other immunizations. Tell your health care provider if you have ever been abused or do not feel safe at home. Summary Menopause is a normal process in which your ability to get pregnant comes to an end. This condition causes hot flashes, night sweats, decreased interest in sex, mood swings, headaches, or lack of sleep. Treatment for this condition may include hormone replacement therapy. Take actions to keep yourself healthy, including exercising regularly, eating a healthy diet, watching your weight, and checking your blood pressure and blood sugar levels. Get screened for cancer and depression. Make sure that you are up to date with all your vaccines. This information is not intended to replace advice given to you by your health care provider. Make sure you discuss any questions you have with your health care provider. Document Revised: 10/06/2020 Document Reviewed: 10/06/2020 Elsevier Patient Education  2024 [employer].

## 2023-07-27 LAB — CBC WITH DIFFERENTIAL/PLATELET
Basophils Absolute: 0.1 10*3/uL (ref 0.0–0.2)
Basos: 1 %
EOS (ABSOLUTE): 0.1 10*3/uL (ref 0.0–0.4)
Eos: 1 %
Hematocrit: 41.8 % (ref 34.0–46.6)
Hemoglobin: 13.8 g/dL (ref 11.1–15.9)
Immature Grans (Abs): 0.1 10*3/uL (ref 0.0–0.1)
Immature Granulocytes: 1 %
Lymphocytes Absolute: 4.3 10*3/uL — ABNORMAL HIGH (ref 0.7–3.1)
Lymphs: 37 %
MCH: 29.3 pg (ref 26.6–33.0)
MCHC: 33 g/dL (ref 31.5–35.7)
MCV: 89 fL (ref 79–97)
Monocytes Absolute: 0.7 10*3/uL (ref 0.1–0.9)
Monocytes: 6 %
Neutrophils Absolute: 6.2 10*3/uL (ref 1.4–7.0)
Neutrophils: 54 %
Platelets: 352 10*3/uL (ref 150–450)
RBC: 4.71 x10E6/uL (ref 3.77–5.28)
RDW: 14.1 % (ref 11.7–15.4)
WBC: 11.5 10*3/uL — ABNORMAL HIGH (ref 3.4–10.8)

## 2023-07-27 NOTE — Assessment & Plan Note (Signed)
 Chronic, suboptimal control.  Discussed anger and irritability. Discussed past relationships.  Patient in agreement with pursuing counseling through employee assistance program at work.  Advised her to let me know if at any point she would like a referral to a counselor outside of her employer.  She will let me know how she is doing.  Continue Wellbutrin 150mg   and Celexa 10mg .

## 2023-07-29 ENCOUNTER — Telehealth: Payer: Self-pay | Admitting: Family

## 2023-07-29 ENCOUNTER — Telehealth: Payer: Self-pay

## 2023-07-29 NOTE — Telephone Encounter (Signed)
 Pt. Given lab results and instructions. States she will call back about repeat lab work.

## 2023-07-29 NOTE — Telephone Encounter (Signed)
 Copied from CRM [id number]. Topic: General - Other >> Jul 29, 2023  3:10 PM Whitney O wrote: Reason for CRM: patient returning call that she received and they left a message to call back . The message was relayed to patient notified office and the orders need to be placed for [facility] . While speaking with [name] on the cal line patient  disconnected tried giving patient a call back but no answer . I did read the labs but didn't get to finish .please give patient a call to make sure everything was understood  Kimberly Mclaughlin

## 2023-07-29 NOTE — Telephone Encounter (Signed)
 Noted.

## 2023-08-04 ENCOUNTER — Encounter: Payer: Self-pay | Admitting: *Deleted

## 2023-09-19 LAB — HM MAMMOGRAPHY

## 2023-09-29 ENCOUNTER — Ambulatory Visit: Admitting: Family

## 2023-09-29 ENCOUNTER — Encounter: Payer: Self-pay | Admitting: Family

## 2023-09-29 VITALS — BP 130/68 | HR 84 | Temp 98.3°F | Ht 64.0 in | Wt 153.0 lb

## 2023-09-29 DIAGNOSIS — I10 Essential (primary) hypertension: Secondary | ICD-10-CM

## 2023-09-29 DIAGNOSIS — R899 Unspecified abnormal finding in specimens from other organs, systems and tissues: Secondary | ICD-10-CM | POA: Diagnosis not present

## 2023-09-29 DIAGNOSIS — F339 Major depressive disorder, recurrent, unspecified: Secondary | ICD-10-CM

## 2023-09-29 MED ORDER — BUPROPION HCL ER (XL) 150 MG PO TB24: 150.0000 mg | ORAL_TABLET | Freq: Every day | ORAL | 3 refills | Status: DC

## 2023-09-29 MED ORDER — CITALOPRAM HYDROBROMIDE 10 MG PO TABS: 10.0000 mg | ORAL_TABLET | Freq: Every day | ORAL | 3 refills | Status: DC

## 2023-09-29 MED ORDER — LISINOPRIL-HYDROCHLOROTHIAZIDE 20-12.5 MG PO TABS: 2.0000 | ORAL_TABLET | Freq: Every morning | ORAL | 3 refills | Status: DC

## 2023-09-29 MED ORDER — AMLODIPINE BESYLATE 5 MG PO TABS: 5.0000 mg | ORAL_TABLET | Freq: Every day | ORAL | 3 refills | Status: DC

## 2023-09-29 NOTE — Progress Notes (Signed)
 Assessment & Plan:  Abnormal laboratory test -     CBC with Differential/Platelet; Future  Depression, recurrent (HCC) Assessment & Plan: Chronic, suboptimal control due to work stressors.  Fortunately patient is leaving existing job and she is very optimistic by her future.  She is focused on her health.  Politely declines any medication changes at this time.  Continue Wellbutrin  150 mg daily, Atarax  10 mg twice daily as needed.  FMLA paperwork completed today  Orders: -     Citalopram  Hydrobromide; Take 1 tablet (10 mg total) by mouth daily.  Dispense: 90 tablet; Refill: 3 -     buPROPion  HCl ER (XL); Take 1 tablet (150 mg total) by mouth daily.  Dispense: 90 tablet; Refill: 3  Essential hypertension Assessment & Plan: Chronic, stable.  Continue lisinopril  hydrochlorothiazide  20-12.5 mg daily 2 tablets every morning, amlodipine  5 mg every afternoon.  Orders: -     Lisinopril -hydroCHLOROthiazide ; Take 2 tablets by mouth every morning.  Dispense: 180 tablet; Refill: 3 -     amLODIPine  Besylate; Take 1 tablet (5 mg total) by mouth daily.  Dispense: 90 tablet; Refill: 3     Return precautions given.   Risks, benefits, and alternatives of the medications and treatment plan prescribed today were discussed, and patient expressed understanding.   Education regarding symptom management and diagnosis given to patient on AVS either electronically or printed.  Return in about 3 months (around 12/30/2023).  Bascom Bossier, FNP  Subjective:    Patient ID: Kimberly Mclaughlin, female    DOB: 31-Dec-1960, 63 y.o.   MRN: [id number]  CC: Kimberly Mclaughlin is a 63 y.o. female who presents today for follow up.   HPI: She has given notice of  leaving employer after 7 years. Leaving 10/14/23.   She has FMLA paperwork to complete from 09/26/23. Being at work causes increased anxiety and she feels raises her blood pressure.   Leaving work has caused increased depression however she is feeling  optimistic and looking forward to this new chapter.  She has a supportive partner.  She has had an interview for a new company yesterday.   She walked 2 miles today and focused on her health.       Allergies: Patient has no known allergies. Current Outpatient Medications on File Prior to Visit  Medication Sig Dispense Refill   Cholecalciferol  1.25 MG (50000 UT) TABS 50,000 units PO qwk for 8 weeks. 8 tablet 0   fluconazole  (DIFLUCAN ) 150 MG tablet Take 1 tab weekly for 4 weeks 4 tablet 0   hydrOXYzine  (ATARAX ) 10 MG tablet Take 1 tablet (10 mg total) by mouth 2 (two) times daily as needed for anxiety. 20 tablet 0   No current facility-administered medications on file prior to visit.    Review of Systems  Constitutional:  Negative for chills and fever.  Respiratory:  Negative for cough.   Cardiovascular:  Negative for chest pain and palpitations.  Gastrointestinal:  Negative for nausea and vomiting.  Psychiatric/Behavioral:  Negative for sleep disturbance and suicidal ideas. The patient is not nervous/anxious.       Objective:    BP 130/68   Pulse 84   Temp 98.3 F (36.8 C) (Oral)   Ht 5\' 4"  (1.626 m)   Wt 153 lb (69.4 kg)   SpO2 99%   BMI 26.26 kg/m  BP Readings from Last 3 Encounters:  09/29/23 130/68  07/26/23 138/86  02/10/23 (!) 150/96   Wt Readings from Last 3 Encounters:  09/29/23  153 lb (69.4 kg)  07/26/23 158 lb 9.6 oz (71.9 kg)  02/10/23 169 lb 3.2 oz (76.7 kg)      09/29/2023    4:10 PM 07/26/2023   12:14 PM 02/10/2023   11:12 AM  Depression screen PHQ 2/9  Decreased Interest 2 0 0  Down, Depressed, Hopeless 1 0 0  PHQ - 2 Score 3 0 0  Altered sleeping 0 0 0  Tired, decreased energy 1 0 0  Change in appetite 0 0 0  Feeling bad or failure about yourself  1 0 0  Trouble concentrating 0 0 0  Moving slowly or fidgety/restless 0 0 0  Suicidal thoughts 0 0 0  PHQ-9 Score 5 0 0  Difficult doing work/chores Somewhat difficult Not difficult at all Not  difficult at all    Physical Exam Vitals reviewed.  Constitutional:      Appearance: She is well-developed.  Eyes:     Conjunctiva/sclera: Conjunctivae normal.  Cardiovascular:     Rate and Rhythm: Normal rate and regular rhythm.     Pulses: Normal pulses.     Heart sounds: Normal heart sounds.  Pulmonary:     Effort: Pulmonary effort is normal.     Breath sounds: Normal breath sounds. No wheezing, rhonchi or rales.  Skin:    General: Skin is warm and dry.  Neurological:     Mental Status: She is alert.  Psychiatric:        Speech: Speech normal.        Behavior: Behavior normal.        Thought Content: Thought content normal.

## 2023-09-30 ENCOUNTER — Telehealth: Payer: Self-pay

## 2023-09-30 NOTE — Assessment & Plan Note (Signed)
 Chronic, stable.  Continue lisinopril  hydrochlorothiazide  20-12.5 mg daily 2 tablets every morning, amlodipine  5 mg every afternoon.

## 2023-09-30 NOTE — Patient Instructions (Signed)
 Nice seeing you today.  Please let me know if anything else from our end.  I am excited for your new chapter.

## 2023-09-30 NOTE — Telephone Encounter (Signed)
 LVM to call back to inform pt that her FMLA paperwork has been faxed  to # provided [phone]  and can she check and make sure they received it and let us  know.

## 2023-09-30 NOTE — Assessment & Plan Note (Signed)
 Chronic, suboptimal control due to work stressors.  Fortunately patient is leaving existing job and she is very optimistic by her future.  She is focused on her health.  Politely declines any medication changes at this time.  Continue Wellbutrin  150 mg daily, Atarax  10 mg twice daily as needed.  FMLA paperwork completed today

## 2023-10-05 ENCOUNTER — Telehealth: Payer: Self-pay

## 2023-10-05 NOTE — Telephone Encounter (Signed)
 Copied from CRM [id number]. Topic: General - Other >> Oct 05, 2023  1:44 PM Martinique E wrote: Reason for CRM: Patient called in wanting to get a message to PCP's nurse in regards to her short-term disability paperwork. Stated her employment faxed these forms again as they are needing her last two office visit notes. Patient is requesting this get done this week as her last day of work is May 16th. Patient did not have  fax number to give agent, but stated the nurse should have that. Callback number for patient is [phone].

## 2023-10-05 NOTE — Telephone Encounter (Signed)
 Spoke to Kimberly Mclaughlin informed her that we had not received any correspondence from her Employer but I went ahead and faxed the last 2 ov notes to the number provided by Kimberly Mclaughlin Fax  # [phone]. With her  claim # [id number]. Received ok that it has been received by employer

## 2023-10-11 NOTE — Telephone Encounter (Signed)
 Copied from CRM [id number]. Topic: Clinical - Medication Question >> Oct 11, 2023  9:44 AM Kimberly Mclaughlin wrote: Reason for CRM: pt called to speak with nurse regarding short term disability approval that was faxed over pt wants to know if it was received. Please call pt back at [phone]. Pt states this is urgent

## 2023-10-11 NOTE — Telephone Encounter (Signed)
 Spoke to pt and informed her that we have not received anything else from the employer I have checked the e-fax and provider folder upfront as well. Pt stated that she will call them and get them to fax info to us 

## 2023-10-11 NOTE — Telephone Encounter (Signed)
 LVM to call back to speak to [facility]

## 2023-10-14 ENCOUNTER — Telehealth: Payer: Self-pay | Admitting: Family

## 2023-10-14 NOTE — Telephone Encounter (Signed)
 Copied from CRM [id number]. Topic: General - Other >> Oct 14, 2023  8:03 AM Emmet Harm C wrote: Reason for CRM: calling to see if Jenette received the fmla paperwork, it is due on the May 19 and to please call if paperwork was received [phone]

## 2023-10-14 NOTE — Telephone Encounter (Signed)
 Spoke to pt and informed her that I did Received paperwork placed in provider folder for review and signature

## 2024-02-13 ENCOUNTER — Telehealth: Payer: Self-pay

## 2024-02-13 NOTE — Telephone Encounter (Signed)
 Spoke to pt scheduled her to see Charan on 02/16/24 for rash

## 2024-02-13 NOTE — Telephone Encounter (Signed)
 Copied from CRM [id number]. Topic: Appointments - Scheduling Inquiry for Clinic >> Feb 13, 2024  8:34 AM Emylou G wrote: Reason for CRM: Checkup - rash .SABRA Says can she see someone else this week instead of waiting for Monday ( secured 10/3)

## 2024-02-16 ENCOUNTER — Ambulatory Visit: Admitting: Nurse Practitioner

## 2024-02-16 ENCOUNTER — Encounter: Payer: Self-pay | Admitting: Nurse Practitioner

## 2024-02-16 VITALS — BP 138/86 | HR 80 | Temp 98.0°F | Ht 64.0 in | Wt 157.8 lb

## 2024-02-16 DIAGNOSIS — R21 Rash and other nonspecific skin eruption: Secondary | ICD-10-CM | POA: Diagnosis not present

## 2024-02-16 MED ORDER — HYDROCORTISONE 1 % EX CREA: 1.0000 | TOPICAL_CREAM | Freq: Two times a day (BID) | CUTANEOUS | 0 refills | Status: AC

## 2024-02-16 NOTE — Patient Instructions (Signed)
 BILATERAL KNEE RASH: You have a rash on both knees that may be due to contact dermatitis or an environmental reaction. There are no signs of infection. -Use over-the-counter cortisone cream for redness. -Monitor the rash for any signs of infection. -Send a picture if the condition worsens. -A prescription for cortisone cream has been sent to [facility].

## 2024-02-16 NOTE — Progress Notes (Signed)
 Established Patient Office Visit  Subjective:  Patient ID: Kimberly Mclaughlin, female    DOB: Jun 18, 1960  Age: 63 y.o. MRN: [id number]  CC:  Chief Complaint  Patient presents with   Acute Visit    Rash on side of legs x 2 days Not painful or itchy   Discussed the use of a AI scribe software for clinical note transcription with the patient, who gave verbal consent to proceed.  HPI  Kimberly Mclaughlin is a 63 year old female who presents with a new skin rash on both knees.  The rash appeared two days ago on both knees and is not itchy. It has shown some improvement with the application of witch hazel, which has helped it dry up. She recalls a similar rash last year that resolved after using witch hazel following a change in her shower head. There have been no recent changes in personal care products. She has been taking baths with sea salt and pink Himalayan salt but does not believe these are the cause of the rash. No warmth is associated with the rash.  She would also like to ger labs done today.  She is concerned about the effectiveness of her medications for anxiety and depression, noting periods of isolation and sadness despite treatment. She would like to get [place] testing done.    HPI   Past Medical History:  Diagnosis Date   Depression    Hypertension     History reviewed. No pertinent surgical history.  Family History  Problem Relation Age of Onset   Cancer Mother 67       breast   Heart disease Maternal Grandmother    Hypertension Maternal Grandmother    Diabetes Maternal Grandmother    Heart disease Maternal Grandfather    Hypertension Maternal Grandfather    Diabetes Maternal Grandfather    Colon cancer Neg Hx     Social History   Socioeconomic History   Marital status: Single    Spouse name: Not on file   Number of children: Not on file   Years of education: Not on file   Highest education level: Not on file  Occupational History   Not on file   Tobacco Use   Smoking status: Former   Smokeless tobacco: Never   Tobacco comments:    smoked cigarretes in Chief of Staff Use   Vaping status: Never Used  Substance and Sexual Activity   Alcohol use: No   Drug use: No   Sexual activity: Not Currently    Partners: Male    Birth control/protection: None  Other Topics Concern   Not on file  Social History Narrative   Works for [employer], chemistry, staining slides.    Social Drivers of Corporate investment banker Strain: Not on file  Food Insecurity: Not on file  Transportation Needs: Not on file  Physical Activity: Not on file  Stress: Not on file  Social Connections: Not on file  Intimate Partner Violence: Not on file     Outpatient Medications Prior to Visit  Medication Sig Dispense Refill   buPROPion  (WELLBUTRIN  XL) 150 MG 24 hr tablet Take 1 tablet (150 mg total) by mouth daily. 90 tablet 3   citalopram  (CELEXA ) 10 MG tablet Take 1 tablet (10 mg total) by mouth daily. 90 tablet 3   lisinopril -hydrochlorothiazide  (ZESTORETIC ) 20-12.5 MG tablet Take 2 tablets by mouth every morning. 180 tablet 3   amLODipine  (NORVASC ) 5 MG tablet Take 1 tablet (5 mg total) by  mouth daily. (Patient not taking: Reported on 02/16/2024) 90 tablet 3   Cholecalciferol  1.25 MG (50000 UT) TABS 50,000 units PO qwk for 8 weeks. (Patient not taking: Reported on 02/16/2024) 8 tablet 0   fluconazole  (DIFLUCAN ) 150 MG tablet Take 1 tab weekly for 4 weeks (Patient not taking: Reported on 02/16/2024) 4 tablet 0   hydrOXYzine  (ATARAX ) 10 MG tablet Take 1 tablet (10 mg total) by mouth 2 (two) times daily as needed for anxiety. (Patient not taking: Reported on 02/16/2024) 20 tablet 0   No facility-administered medications prior to visit.    No Known Allergies  ROS Review of Systems Negative unless indicated in HPI.    Objective:    Physical Exam Constitutional:      Appearance: Normal appearance.  Cardiovascular:     Rate and Rhythm: Normal rate  and regular rhythm.     Pulses: Normal pulses.     Heart sounds: Normal heart sounds.  Pulmonary:     Effort: Pulmonary effort is normal.     Breath sounds: Normal breath sounds.  Musculoskeletal:     Cervical back: Normal range of motion. No tenderness.  Skin:    Findings: Rash present.     Comments: Scattered multiple slightly erythematous maculopapular rash located bilateral knees. No warmth, induration or drainage noticed.  Neurological:     General: No focal deficit present.     Mental Status: She is alert and oriented to person, place, and time. Mental status is at baseline.  Psychiatric:        Mood and Affect: Mood normal.        Behavior: Behavior normal.        Thought Content: Thought content normal.        Judgment: Judgment normal.            BP 138/86   Pulse 80   Temp 98 F (36.7 C)   Ht 5' 4 (1.626 m)   Wt 157 lb 12.8 oz (71.6 kg)   SpO2 99%   BMI 27.09 kg/m  Wt Readings from Last 3 Encounters:  02/16/24 157 lb 12.8 oz (71.6 kg)  09/29/23 153 lb (69.4 kg)  07/26/23 158 lb 9.6 oz (71.9 kg)     Health Maintenance  Topic Date Due   DTaP/Tdap/Td (1 - Tdap) Never done   Cervical Cancer Screening (HPV/Pap Cotest)  Never done   Colonoscopy  Never done   Pneumococcal Vaccine: 50+ Years (1 of 1 - PCV) Never done   Zoster Vaccines- Shingrix (1 of 2) Never done   COVID-19 Vaccine (3 - 2025-26 season) 01/30/2024   Influenza Vaccine  08/28/2024 (Originally 12/30/2023)   Mammogram  09/18/2025   Hepatitis B Vaccines 19-59 Average Risk  Aged Out   HPV VACCINES  Aged Out   Meningococcal B Vaccine  Aged Out   Hepatitis C Screening  Discontinued   HIV Screening  Discontinued    There are no preventive care reminders to display for this patient.  Lab Results  Component Value Date   TSH 0.69 12/08/2021   Lab Results  Component Value Date   WBC 11.5 (H) 07/26/2023   HGB 13.8 07/26/2023   HCT 41.8 07/26/2023   MCV 89 07/26/2023   PLT 352 07/26/2023   Lab  Results  Component Value Date   NA 143 02/10/2023   K 3.9 02/10/2023   CO2 23 02/10/2023   GLUCOSE 96 02/10/2023   BUN 13 02/10/2023   CREATININE 0.76 02/10/2023  BILITOT 0.3 02/10/2023   ALKPHOS 93 02/10/2023   AST 21 02/10/2023   ALT 17 02/10/2023   PROT 7.9 02/10/2023   ALBUMIN 4.6 02/10/2023   CALCIUM  10.3 02/10/2023   EGFR 89 02/10/2023   GFR 87.61 06/09/2022   Lab Results  Component Value Date   CHOL CANCELED 12/03/2022   Lab Results  Component Value Date   HDL CANCELED 12/03/2022   Lab Results  Component Value Date   LDLCALC 111 (H) 12/03/2022   Lab Results  Component Value Date   TRIG CANCELED 12/03/2022   Lab Results  Component Value Date   CHOLHDL 3.1 12/03/2022   Lab Results  Component Value Date   HGBA1C 5.7 (A) 10/11/2022      Assessment & Plan:  Rash Assessment & Plan: Possible contact dermatitis or environmental reaction. No infection signs. Previous similar rash resolved with witch hazel. - Recommend OTC cortisone cream. - Advise monitoring for infection signs. - Instruct to let us  know if symptoms worsen.    Orders: -     CBC -     Comprehensive metabolic panel with GFR  Other orders -     Hydrocortisone ; Apply 1 Application topically 2 (two) times daily.  Dispense: 30 g; Refill: 0    Follow-up: Return if symptoms worsen or fail to improve.   Yuleni Burich, NP

## 2024-02-16 NOTE — Assessment & Plan Note (Signed)
 Possible contact dermatitis or environmental reaction. No infection signs. Previous similar rash resolved with witch hazel. - Recommend OTC cortisone cream. - Advise monitoring for infection signs. - Instruct to let us  know if symptoms worsen.

## 2024-02-17 ENCOUNTER — Telehealth: Payer: Self-pay

## 2024-02-17 LAB — COMPREHENSIVE METABOLIC PANEL WITH GFR
ALT: 13 IU/L (ref 0–32)
AST: 18 IU/L (ref 0–40)
Albumin: 4.4 g/dL (ref 3.9–4.9)
Alkaline Phosphatase: 84 IU/L (ref 49–135)
BUN/Creatinine Ratio: 18 (ref 12–28)
BUN: 15 mg/dL (ref 8–27)
Bilirubin Total: 0.2 mg/dL (ref 0.0–1.2)
CO2: 23 mmol/L (ref 20–29)
Calcium: 9.7 mg/dL (ref 8.7–10.3)
Chloride: 101 mmol/L (ref 96–106)
Creatinine, Ser: 0.82 mg/dL (ref 0.57–1.00)
Globulin, Total: 2.6 g/dL (ref 1.5–4.5)
Glucose: 88 mg/dL (ref 70–99)
Potassium: 3.5 mmol/L (ref 3.5–5.2)
Sodium: 140 mmol/L (ref 134–144)
Total Protein: 7 g/dL (ref 6.0–8.5)
eGFR: 81 mL/min/1.73 (ref >59)

## 2024-02-17 LAB — CBC
Hematocrit: 46 % (ref 34.0–46.6)
Hemoglobin: 14.4 g/dL (ref 11.1–15.9)
MCH: 29.1 pg (ref 26.6–33.0)
MCHC: 31.3 g/dL — ABNORMAL LOW (ref 31.5–35.7)
MCV: 93 fL (ref 79–97)
Platelets: 357 x10E3/uL (ref 150–450)
RBC: 4.95 x10E6/uL (ref 3.77–5.28)
RDW: 14.1 % (ref 11.7–15.4)
WBC: 12.2 x10E3/uL — ABNORMAL HIGH (ref 3.4–10.8)

## 2024-02-17 NOTE — Telephone Encounter (Signed)
 Left 2 messages to call the office back due to Kimberly Mclaughlin needs to discuss a test with her. If Patient calls back please end her through.

## 2024-03-02 ENCOUNTER — Ambulatory Visit: Admitting: Family

## 2024-03-04 ENCOUNTER — Other Ambulatory Visit: Payer: Self-pay | Admitting: Family

## 2024-03-04 DIAGNOSIS — F339 Major depressive disorder, recurrent, unspecified: Secondary | ICD-10-CM

## 2024-03-06 ENCOUNTER — Other Ambulatory Visit: Payer: Self-pay | Admitting: Family

## 2024-03-06 DIAGNOSIS — I10 Essential (primary) hypertension: Secondary | ICD-10-CM

## 2024-03-06 DIAGNOSIS — F339 Major depressive disorder, recurrent, unspecified: Secondary | ICD-10-CM

## 2024-04-04 ENCOUNTER — Telehealth: Payer: Self-pay

## 2024-04-04 NOTE — Telephone Encounter (Signed)
 Copied from CRM [id number]. Topic: Clinical - Medication Question >> Apr 04, 2024  8:21 AM Emylou G wrote: Reason for CRM: Patient would like nausea med if possible script: phenergan .SABRA She does have an appt

## 2024-04-05 NOTE — Telephone Encounter (Signed)
 LVM stating for the pt to call office back to get more information on her nausea symptoms

## 2024-04-06 ENCOUNTER — Ambulatory Visit: Admitting: Family

## 2024-04-10 NOTE — Telephone Encounter (Signed)
 Noted  VM has been left with pt

## 2024-04-11 NOTE — Telephone Encounter (Signed)
 Pt has appt scheduled with you on 04/18/24 to discuss

## 2024-04-12 ENCOUNTER — Ambulatory Visit: Payer: Self-pay | Admitting: Nurse Practitioner

## 2024-04-12 NOTE — Progress Notes (Signed)
 Late entry: The lab results was discussed with pt on phone and provided information that pt requested for gene sight testing. Pt will check with the insurance and will update us  if the test thing is covered by her insurance and she and if she would be agreeable to see Dr. Chipper for GeneSight testing

## 2024-04-17 NOTE — Telephone Encounter (Signed)
 open in error

## 2024-04-18 ENCOUNTER — Ambulatory Visit: Admitting: Family

## 2024-04-18 ENCOUNTER — Encounter: Payer: Self-pay | Admitting: Family

## 2024-04-18 VITALS — BP 120/62 | HR 91 | Temp 98.8°F | Ht 64.0 in | Wt 155.6 lb

## 2024-04-18 DIAGNOSIS — R5383 Other fatigue: Secondary | ICD-10-CM | POA: Insufficient documentation

## 2024-04-18 DIAGNOSIS — I10 Essential (primary) hypertension: Secondary | ICD-10-CM

## 2024-04-18 DIAGNOSIS — Z1211 Encounter for screening for malignant neoplasm of colon: Secondary | ICD-10-CM

## 2024-04-18 DIAGNOSIS — R7309 Other abnormal glucose: Secondary | ICD-10-CM | POA: Diagnosis not present

## 2024-04-18 DIAGNOSIS — F339 Major depressive disorder, recurrent, unspecified: Secondary | ICD-10-CM

## 2024-04-18 DIAGNOSIS — E559 Vitamin D deficiency, unspecified: Secondary | ICD-10-CM | POA: Diagnosis not present

## 2024-04-18 DIAGNOSIS — R1110 Vomiting, unspecified: Secondary | ICD-10-CM | POA: Insufficient documentation

## 2024-04-18 DIAGNOSIS — R1114 Bilious vomiting: Secondary | ICD-10-CM

## 2024-04-18 MED ORDER — PROMETHAZINE HCL 12.5 MG PO TABS: 12.5000 mg | ORAL_TABLET | Freq: Three times a day (TID) | ORAL | 0 refills | Status: AC | PRN

## 2024-04-18 MED ORDER — BUPROPION HCL ER (XL) 150 MG PO TB24: 150.0000 mg | ORAL_TABLET | Freq: Every day | ORAL | 3 refills | Status: AC

## 2024-04-18 MED ORDER — CITALOPRAM HYDROBROMIDE 10 MG PO TABS: ORAL_TABLET | ORAL | 3 refills | Status: AC

## 2024-04-18 NOTE — Progress Notes (Signed)
 Assessment & Plan:  Other fatigue Assessment & Plan: Etiology is likely multifactorial.  Discussed lack of exercise.  She is sleeping well.  Pending labs to evaluate for metabolic etiology.  Advised for her to follow-up with me right away if exercise does not improve level of energy.  Orders: -     TSH; Future -     CBC with Differential/Platelet; Future -     B12 and Folate Panel; Future  Depression, recurrent Assessment & Plan: Chronic, improved with new employer.  Continue Wellbutrin  150 mg daily, celexa  10mg  every day.   Orders: -     buPROPion  HCl ER (XL); Take 1 tablet (150 mg total) by mouth daily.  Dispense: 90 tablet; Refill: 3 -     Citalopram  Hydrobromide; TAKE 1 TABLET(10 MG) BY MOUTH DAILY  Dispense: 90 tablet; Refill: 3  Essential hypertension Assessment & Plan: Chronic, stable.  Continue lisinopril  hydrochlorothiazide  20-12.5 mg daily 2 tablets every morning.  She is no longer on amlodipine .   Orders: -     Comprehensive metabolic panel with GFR; Future -     Hemoglobin A1c; Future  Elevated glucose -     Lipid panel; Future  Vitamin D  deficiency  Screen for colon cancer -     Ambulatory referral to Gastroenterology; Future  Bilious vomiting with nausea Assessment & Plan: 2 episodes over the last year.  Completely resolved at this time.  Discussed unknown etiology however cookout, alcohol may certainly have contributed.  Discussed alarm features in regards to abdominal pain, inability to stay hydrated, blood in stool or vomit, fever which would warrant prompt in person evaluation.  I did provide her with 5 tablets of Phenergan after we had a long discussion that recurrence symptoms would still warrant evaluation.  She verbalized understanding   Other orders -     Promethazine HCl; Take 1 tablet (12.5 mg total) by mouth every 8 (eight) hours as needed for nausea or vomiting.  Dispense: 5 tablet; Refill: 0     Return precautions given.   Risks,  benefits, and alternatives of the medications and treatment plan prescribed today were discussed, and patient expressed understanding.   Education regarding symptom management and diagnosis given to patient on AVS either electronically or printed.  Return in about 6 months (around 10/16/2024).  Rollene Northern, FNP  Subjective:    Patient ID: Kimberly Mclaughlin, female    DOB: [birth year], 63 y.o.   MRN: [id number]  CC: Kimberly Mclaughlin is a 63 y.o. female who presents today for follow up.   HPI: HPI Discussed the use of AI scribe software for clinical note transcription with the patient, who gave verbal consent to proceed.  History of Present Illness   Kimberly Mclaughlin is a 63 year old female who presents for a follow-up visit.  Feels well today.  No new complaints.  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  She would like a prescription for Phenergan.  Over the past year she has had 2 episodes of nausea and vomiting approximately twice a year, with the most recent episode occurring after consuming moonshine at a cookout. These episodes last for 4 days and are characterized by vomiting non bloody bile and mucus, without epigastric burning ,fever or chills.  She questions if she had food poisoning.  She has used Phenergan provided by a  neighbor which resolve symptoms.   She has joined [employer] and plans to start exercising to improve her energy levels. She reports episodic fatigue particularly at the end of her work week, but attributes this to her work schedule. She sleeps well . denies chest pain, shortness of breath, or unusual weight loss.    Mammogram is UTD Due pap smear; following with GYN Interested in PCV20 vaccine; Allergies: Patient has no known allergies. Current Outpatient Medications on File Prior to Visit  Medication Sig Dispense  Refill   Cholecalciferol  1.25 MG (50000 UT) TABS 50,000 units PO qwk for 8 weeks. 8 tablet 0   fluconazole  (DIFLUCAN ) 150 MG tablet Take 1 tab weekly for 4 weeks 4 tablet 0   hydrocortisone  cream 1 % Apply 1 Application topically 2 (two) times daily. 30 g 0   lisinopril -hydrochlorothiazide  (ZESTORETIC ) 20-12.5 MG tablet TAKE 2 TABLETS BY MOUTH EVERY MORNING 180 tablet 1   No current facility-administered medications on file prior to visit.    Review of Systems  Constitutional:  Negative for chills and fever.  Respiratory:  Negative for cough.   Cardiovascular:  Negative for chest pain and palpitations.  Gastrointestinal:  Negative for nausea and vomiting.      Objective:    BP 120/62   Pulse 91   Temp 98.8 F (37.1 C) (Oral)   Ht 5' 4 (1.626 m)   Wt 155 lb 9.6 oz (70.6 kg)   SpO2 99%   BMI 26.71 kg/m  BP Readings from Last 3 Encounters:  04/18/24 120/62  02/16/24 138/86  09/29/23 130/68   Wt Readings from Last 3 Encounters:  04/18/24 155 lb 9.6 oz (70.6 kg)  02/16/24 157 lb 12.8 oz (71.6 kg)  09/29/23 153 lb (69.4 kg)    Physical Exam Vitals reviewed.  Constitutional:      Appearance: She is well-developed.  Eyes:     Conjunctiva/sclera: Conjunctivae normal.  Cardiovascular:     Rate and Rhythm: Normal rate and regular rhythm.     Pulses: Normal pulses.     Heart sounds: Normal heart sounds.  Pulmonary:     Effort: Pulmonary effort is normal.     Breath sounds: Normal breath sounds. No wheezing, rhonchi or rales.  Skin:    General: Skin is warm and dry.  Neurological:     Mental Status: She is alert.  Psychiatric:        Speech: Speech normal.        Behavior: Behavior normal.        Thought Content: Thought content normal.

## 2024-04-18 NOTE — Assessment & Plan Note (Signed)
 2 episodes over the last year.  Completely resolved at this time.  Discussed unknown etiology however cookout, alcohol may certainly have contributed.  Discussed alarm features in regards to abdominal pain, inability to stay hydrated, blood in stool or vomit, fever which would warrant prompt in person evaluation.  I did provide her with 5 tablets of Phenergan after we had a long discussion that recurrence symptoms would still warrant evaluation.  She verbalized understanding

## 2024-04-18 NOTE — Assessment & Plan Note (Signed)
 Etiology is likely multifactorial.  Discussed lack of exercise.  She is sleeping well.  Pending labs to evaluate for metabolic etiology.  Advised for her to follow-up with me right away if exercise does not improve level of energy.

## 2024-04-18 NOTE — Assessment & Plan Note (Signed)
 Chronic, stable.  Continue lisinopril  hydrochlorothiazide  20-12.5 mg daily 2 tablets every morning.  She is no longer on amlodipine .

## 2024-04-18 NOTE — Assessment & Plan Note (Signed)
 Chronic, improved with new employer.  Continue Wellbutrin  150 mg daily, celexa  10mg  every day.

## 2024-04-18 NOTE — Patient Instructions (Addendum)
 Pap smear with GYN  Increase exercise and monitor for fatigue; please return if doesn't resolve.   Labs when fasting.   Nice to see you!

## 2024-04-18 NOTE — Telephone Encounter (Signed)
 open in error

## 2024-04-23 ENCOUNTER — Ambulatory Visit

## 2024-05-04 ENCOUNTER — Other Ambulatory Visit

## 2024-05-22 ENCOUNTER — Other Ambulatory Visit

## 2024-05-25 ENCOUNTER — Other Ambulatory Visit (INDEPENDENT_AMBULATORY_CARE_PROVIDER_SITE_OTHER)

## 2024-05-25 DIAGNOSIS — I10 Essential (primary) hypertension: Secondary | ICD-10-CM | POA: Diagnosis not present

## 2024-05-25 DIAGNOSIS — R7309 Other abnormal glucose: Secondary | ICD-10-CM | POA: Diagnosis not present

## 2024-05-25 DIAGNOSIS — R5383 Other fatigue: Secondary | ICD-10-CM

## 2024-05-25 LAB — B12 AND FOLATE PANEL
Folate: 5.2 ng/mL — ABNORMAL LOW
Vitamin B-12: 303 pg/mL (ref 211–911)

## 2024-05-25 LAB — COMPREHENSIVE METABOLIC PANEL WITH GFR
ALT: 11 U/L (ref 3–35)
AST: 12 U/L (ref 5–37)
Albumin: 4 g/dL (ref 3.5–5.2)
Alkaline Phosphatase: 61 U/L (ref 39–117)
BUN: 15 mg/dL (ref 6–23)
CO2: 30 meq/L (ref 19–32)
Calcium: 9.4 mg/dL (ref 8.4–10.5)
Chloride: 103 meq/L (ref 96–112)
Creatinine, Ser: 0.68 mg/dL (ref 0.40–1.20)
GFR: 93.02 mL/min
Glucose, Bld: 98 mg/dL (ref 70–99)
Potassium: 3.8 meq/L (ref 3.5–5.1)
Sodium: 140 meq/L (ref 135–145)
Total Bilirubin: 0.2 mg/dL (ref 0.2–1.2)
Total Protein: 6.6 g/dL (ref 6.0–8.3)

## 2024-05-25 LAB — CBC WITH DIFFERENTIAL/PLATELET
Basophils Absolute: 0.1 K/uL (ref 0.0–0.1)
Basophils Relative: 1.1 % (ref 0.0–3.0)
Eosinophils Absolute: 0.3 K/uL (ref 0.0–0.7)
Eosinophils Relative: 2.8 % (ref 0.0–5.0)
HCT: 40.4 % (ref 36.0–46.0)
Hemoglobin: 13.2 g/dL (ref 12.0–15.0)
Lymphocytes Relative: 41.7 % (ref 12.0–46.0)
Lymphs Abs: 3.9 K/uL (ref 0.7–4.0)
MCHC: 32.6 g/dL (ref 30.0–36.0)
MCV: 88.5 fl (ref 78.0–100.0)
Monocytes Absolute: 0.8 K/uL (ref 0.1–1.0)
Monocytes Relative: 8.4 % (ref 3.0–12.0)
Neutro Abs: 4.3 K/uL (ref 1.4–7.7)
Neutrophils Relative %: 46 % (ref 43.0–77.0)
Platelets: 311 K/uL (ref 150.0–400.0)
RBC: 4.56 Mil/uL (ref 3.87–5.11)
RDW: 15.3 % (ref 11.5–15.5)
WBC: 9.2 K/uL (ref 4.0–10.5)

## 2024-05-25 LAB — LIPID PANEL
Cholesterol: 172 mg/dL (ref 28–200)
HDL: 61.9 mg/dL
LDL Cholesterol: 91 mg/dL (ref 10–99)
NonHDL: 109.97
Total CHOL/HDL Ratio: 3
Triglycerides: 97 mg/dL (ref 10.0–149.0)
VLDL: 19.4 mg/dL (ref 0.0–40.0)

## 2024-05-25 LAB — TSH: TSH: 0.31 u[IU]/mL — ABNORMAL LOW (ref 0.35–5.50)

## 2024-05-28 LAB — HEMOGLOBIN A1C: Hgb A1c MFr Bld: 6 % (ref 4.6–6.5)

## 2024-06-14 ENCOUNTER — Ambulatory Visit: Payer: Self-pay | Admitting: Family

## 2024-06-18 NOTE — Telephone Encounter (Unsigned)
 Copied from CRM [id number]. Topic: Clinical - Lab/Test Results >> Jun 18, 2024  9:23 AM Kimberly Mclaughlin wrote: Reason for CRM: Patient called back insisting to speak with provider nurse because she is a little worried that her labs were not provided over the phone and she would like to speak with her nurse before the appointment.

## 2024-06-21 NOTE — Telephone Encounter (Signed)
 Copied from CRM [id number]. Topic: Clinical - Lab/Test Results >> Jun 18, 2024  9:23 AM Kimberly Mclaughlin wrote: Reason for CRM: Patient called back insisting to speak with provider nurse because she is a little worried that her labs were not provided over the phone and she would like to speak with her nurse before the appointment. >> Jun 21, 2024  8:50 AM Kimberly Mclaughlin wrote: Patient called.. I did confirm her appt.. she is okay to discuss when she comes in Feb..

## 2024-07-03 ENCOUNTER — Ambulatory Visit: Admitting: Family

## 2024-07-09 ENCOUNTER — Encounter: Admission: RE | Payer: Self-pay | Source: Home / Self Care

## 2024-07-09 ENCOUNTER — Ambulatory Visit: Admission: RE | Admit: 2024-07-09 | Source: Home / Self Care | Admitting: Gastroenterology

## 2024-10-16 ENCOUNTER — Ambulatory Visit: Admitting: Family
